# Patient Record
Sex: Male | Born: 1980 | Race: White | Hispanic: No | Marital: Married | Smoking: Never smoker
Health system: Southern US, Community
[De-identification: ages and names within clinical notes are randomized; demographics above are authoritative.]

## PROBLEM LIST (undated history)

## (undated) DIAGNOSIS — K219 Gastro-esophageal reflux disease without esophagitis: Secondary | ICD-10-CM

---

## 2006-03-09 ENCOUNTER — Emergency Department: Payer: Self-pay | Admitting: Emergency Medicine

## 2006-12-25 ENCOUNTER — Emergency Department: Payer: Self-pay | Admitting: Emergency Medicine

## 2007-01-28 ENCOUNTER — Emergency Department: Payer: Self-pay | Admitting: Emergency Medicine

## 2007-02-04 ENCOUNTER — Ambulatory Visit: Payer: Self-pay | Admitting: Urology

## 2007-10-14 ENCOUNTER — Emergency Department: Payer: Self-pay | Admitting: Emergency Medicine

## 2007-10-21 ENCOUNTER — Emergency Department: Payer: Self-pay | Admitting: Emergency Medicine

## 2008-06-11 ENCOUNTER — Emergency Department: Payer: Self-pay | Admitting: Internal Medicine

## 2009-05-26 ENCOUNTER — Emergency Department (HOSPITAL_COMMUNITY): Admission: EM | Admit: 2009-05-26 | Discharge: 2009-05-26 | Payer: Self-pay | Admitting: Emergency Medicine

## 2010-05-26 ENCOUNTER — Emergency Department (HOSPITAL_COMMUNITY): Admission: EM | Admit: 2010-05-26 | Discharge: 2009-12-06 | Payer: Self-pay | Admitting: Emergency Medicine

## 2013-01-04 ENCOUNTER — Ambulatory Visit: Payer: Self-pay | Admitting: Emergency Medicine

## 2013-01-04 LAB — DOT URINE DIP: Protein: NEGATIVE

## 2013-05-07 ENCOUNTER — Emergency Department: Payer: Self-pay | Admitting: Emergency Medicine

## 2013-09-14 ENCOUNTER — Emergency Department: Payer: Self-pay | Admitting: Internal Medicine

## 2013-09-14 LAB — CBC WITH DIFFERENTIAL/PLATELET
BASOS ABS: 0.1 10*3/uL (ref 0.0–0.1)
BASOS PCT: 1.3 %
EOS ABS: 0.1 10*3/uL (ref 0.0–0.7)
EOS PCT: 1.6 %
HCT: 42.1 % (ref 40.0–52.0)
HGB: 14 g/dL (ref 13.0–18.0)
LYMPHS PCT: 22.5 %
Lymphocyte #: 1.8 10*3/uL (ref 1.0–3.6)
MCH: 30.2 pg (ref 26.0–34.0)
MCHC: 33.3 g/dL (ref 32.0–36.0)
MCV: 91 fL (ref 80–100)
MONO ABS: 0.7 x10 3/mm (ref 0.2–1.0)
MONOS PCT: 9.5 %
NEUTROS PCT: 65.1 %
Neutrophil #: 5.1 10*3/uL (ref 1.4–6.5)
PLATELETS: 212 10*3/uL (ref 150–440)
RBC: 4.63 10*6/uL (ref 4.40–5.90)
RDW: 13.1 % (ref 11.5–14.5)
WBC: 7.9 10*3/uL (ref 3.8–10.6)

## 2013-09-14 LAB — URINALYSIS, COMPLETE
BACTERIA: NONE SEEN
BILIRUBIN, UR: NEGATIVE
BLOOD: NEGATIVE
Glucose,UR: NEGATIVE mg/dL (ref 0–75)
Ketone: NEGATIVE
Leukocyte Esterase: NEGATIVE
NITRITE: NEGATIVE
PH: 5 (ref 4.5–8.0)
Protein: NEGATIVE
RBC, UR: NONE SEEN /HPF (ref 0–5)
SPECIFIC GRAVITY: 1.02 (ref 1.003–1.030)
Squamous Epithelial: NONE SEEN
WBC UR: <1 /HPF (ref 0–5)

## 2013-09-14 LAB — COMPREHENSIVE METABOLIC PANEL
ALK PHOS: 61 U/L
AST: 12 U/L — AB (ref 15–37)
Albumin: 3.8 g/dL (ref 3.4–5.0)
Anion Gap: 4 — ABNORMAL LOW (ref 7–16)
BILIRUBIN TOTAL: 0.4 mg/dL (ref 0.2–1.0)
BUN: 12 mg/dL (ref 7–18)
CHLORIDE: 106 mmol/L (ref 98–107)
CREATININE: 1.03 mg/dL (ref 0.60–1.30)
Calcium, Total: 8.8 mg/dL (ref 8.5–10.1)
Co2: 30 mmol/L (ref 21–32)
EGFR (African American): >60
EGFR (Non-African Amer.): >60
Glucose: 79 mg/dL (ref 65–99)
Osmolality: 278 (ref 275–301)
POTASSIUM: 3.8 mmol/L (ref 3.5–5.1)
SGPT (ALT): 17 U/L (ref 12–78)
SODIUM: 140 mmol/L (ref 136–145)
TOTAL PROTEIN: 7 g/dL (ref 6.4–8.2)

## 2013-09-14 LAB — LIPASE, BLOOD: LIPASE: 120 U/L (ref 73–393)

## 2013-09-14 LAB — TROPONIN I: Troponin-I: <0.02 ng/mL

## 2016-02-04 ENCOUNTER — Emergency Department
Admission: EM | Admit: 2016-02-04 | Discharge: 2016-02-05 | Disposition: A | Discharge status: Home / Self Care | Payer: BLUE CROSS/BLUE SHIELD | Attending: Emergency Medicine | Admitting: Emergency Medicine

## 2016-02-04 DIAGNOSIS — R1900 Intra-abdominal and pelvic swelling, mass and lump, unspecified site: Secondary | ICD-10-CM

## 2016-02-04 DIAGNOSIS — K219 Gastro-esophageal reflux disease without esophagitis: Secondary | ICD-10-CM | POA: Insufficient documentation

## 2016-02-04 DIAGNOSIS — Z87891 Personal history of nicotine dependence: Secondary | ICD-10-CM | POA: Insufficient documentation

## 2016-02-04 DIAGNOSIS — Z79899 Other long term (current) drug therapy: Secondary | ICD-10-CM | POA: Insufficient documentation

## 2016-02-04 DIAGNOSIS — R1907 Generalized intra-abdominal and pelvic swelling, mass and lump: Secondary | ICD-10-CM | POA: Diagnosis present

## 2016-02-04 HISTORY — DX: Gastro-esophageal reflux disease without esophagitis: K21.9

## 2016-02-04 LAB — CBC
HEMATOCRIT: 41.6 % (ref 40.0–52.0)
Hemoglobin: 14.5 g/dL (ref 13.0–18.0)
MCH: 31.1 pg (ref 26.0–34.0)
MCHC: 34.8 g/dL (ref 32.0–36.0)
MCV: 89.1 fL (ref 80.0–100.0)
Platelets: 200 10*3/uL (ref 150–440)
RBC: 4.67 MIL/uL (ref 4.40–5.90)
RDW: 13.3 % (ref 11.5–14.5)
WBC: 7.2 10*3/uL (ref 3.8–10.6)

## 2016-02-04 LAB — BASIC METABOLIC PANEL
ANION GAP: 6 (ref 5–15)
BUN: 13 mg/dL (ref 6–20)
CALCIUM: 9 mg/dL (ref 8.9–10.3)
CO2: 26 mmol/L (ref 22–32)
Chloride: 106 mmol/L (ref 101–111)
Creatinine, Ser: 0.97 mg/dL (ref 0.61–1.24)
GLUCOSE: 109 mg/dL — AB (ref 65–99)
POTASSIUM: 3.6 mmol/L (ref 3.5–5.1)
Sodium: 138 mmol/L (ref 135–145)

## 2016-02-04 NOTE — ED Triage Notes (Signed)
Patient reports recently started a new medication and now with abdominal swelling and dizziness.

## 2016-02-05 ENCOUNTER — Encounter: Payer: Self-pay | Admitting: Emergency Medicine

## 2016-02-05 ENCOUNTER — Emergency Department: Payer: BLUE CROSS/BLUE SHIELD

## 2016-02-05 LAB — URINALYSIS COMPLETE WITH MICROSCOPIC (ARMC ONLY)
BILIRUBIN URINE: NEGATIVE
Bacteria, UA: NONE SEEN
GLUCOSE, UA: NEGATIVE mg/dL
HGB URINE DIPSTICK: NEGATIVE
KETONES UR: NEGATIVE mg/dL
LEUKOCYTES UA: NEGATIVE
NITRITE: NEGATIVE
PH: 6 (ref 5.0–8.0)
Protein, ur: NEGATIVE mg/dL
Specific Gravity, Urine: 1.009 (ref 1.005–1.030)
Squamous Epithelial / LPF: NONE SEEN

## 2016-02-05 LAB — HEPATIC FUNCTION PANEL
ALK PHOS: 52 U/L (ref 38–126)
ALT: 20 U/L (ref 17–63)
AST: 22 U/L (ref 15–41)
Albumin: 4.3 g/dL (ref 3.5–5.0)
BILIRUBIN TOTAL: 0.4 mg/dL (ref 0.3–1.2)
Total Protein: 6.9 g/dL (ref 6.5–8.1)

## 2016-02-05 LAB — LIPASE, BLOOD: LIPASE: 19 U/L (ref 11–51)

## 2016-02-05 MED ORDER — SUCRALFATE 1 GM/10ML PO SUSP: 1.0000 g | Freq: Four times a day (QID) | ORAL | 1 refills | Status: AC

## 2016-02-05 MED ORDER — SODIUM CHLORIDE 0.9 % IV BOLUS (SEPSIS)
1000.0000 mL | Freq: Once | INTRAVENOUS | Status: AC
  Administered 2016-02-05: 1000 mL via INTRAVENOUS

## 2016-02-05 MED ORDER — DIAZEPAM 5 MG/ML IJ SOLN
2.0000 mg | Freq: Once | INTRAMUSCULAR | Status: AC
  Administered 2016-02-05: 2 mg via INTRAVENOUS
  Filled 2016-02-05: qty 2

## 2016-02-05 MED ORDER — FAMOTIDINE IN NACL 20-0.9 MG/50ML-% IV SOLN
20.0000 mg | Freq: Once | INTRAVENOUS | Status: AC
  Administered 2016-02-05: 20 mg via INTRAVENOUS
  Filled 2016-02-05: qty 50

## 2016-02-05 MED ORDER — DIATRIZOATE MEGLUMINE & SODIUM 66-10 % PO SOLN
15.0000 mL | Freq: Once | ORAL | Status: AC
  Administered 2016-02-05: 15 mL via ORAL

## 2016-02-05 NOTE — Discharge Instructions (Signed)
1. You may stop Protonix. 2. Start Carafate as prescribed. 3. Return to the ER for worsening symptoms, persistent vomiting, difficult to breathing or other concerns.

## 2016-02-05 NOTE — Consult Note (Signed)
Prelim on ABD CT from today.  Images prelimed on machine due to technical error. No prior available.  Mandatory followup on final report.  Abd pain.  Small free fluid may reflect intraabd inflammation but no source is identified. Normal appendix. Left renal milk of calcium  DW Dr Algis LimingSung  JWatts MD

## 2016-02-05 NOTE — ED Provider Notes (Signed)
[facility] Emergency Department Provider Note   ____________________________________________   First MD Initiated Contact with Patient 02/05/16 0002     (approximate)  I have reviewed the triage vital signs and the nursing notes.   HISTORY  Chief Complaint Dizziness    HPI Louis Cannon is a 35 y.o. male who presents to the ED from home with a chief complaint of dizziness and abdominal swelling. Patient has been undergoing outpatient evaluation for worsening reflux. He was started on Protonix one week ago. Reports that after he began Protonix, he started to experience generalized abdominal swelling with bloating and burning sensation. Also reports onset of dizziness like the room is spinning since starting Protonix. 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 [facility]. States he did not have any imaging studies and presents to this emergency department requesting further evaluation for the etiology of his abdominal swelling. Denies associated fever, chills, chest pain, shortness of breath, nausea, vomiting, diarrhea. Denies recent travel or trauma. Denies excessive EtOH, NSAIDs, spicy food use. States he has tried OTC acid blockers and Tums without relief of symptoms. Reports he is supposed to start Carafate. Otherwise, nothing makes his symptoms better or worse.   Past medical history GERD  There are no active problems to display for this patient.   Past surgical history None  Prior to Admission medications   Medication Sig Start Date End Date Taking? Authorizing Provider  pantoprazole (PROTONIX) 40 MG tablet Take 40 mg by mouth daily.   Yes Historical Provider, MD  sucralfate (CARAFATE) 1 GM/10ML suspension Take 10 mLs (1 g total) by mouth 4 (four) times daily. 02/05/16   Irean HongJade J Roena Sassaman, MD    Allergies Review of patient's allergies indicates no known allergies.  History  reviewed. No pertinent family history.  Social History Social History  Substance Use Topics  . Smoking status: Never Smoker  . Smokeless tobacco: Former NeurosurgeonUser  . Alcohol use No  + smoker  Review of Systems  Constitutional: No fever/chills. Eyes: No visual changes. ENT: No sore throat. Cardiovascular: Denies chest pain. Respiratory: Denies shortness of breath. Gastrointestinal: Positive for abdominal pain and swelling.  No nausea, no vomiting.  No diarrhea.  No constipation. Genitourinary: Negative for dysuria. Musculoskeletal: Negative for back pain. Skin: Negative for rash. Neurological: Positive for dizziness. Negative for headaches, focal weakness or numbness.  10-point ROS otherwise negative.  ____________________________________________   PHYSICAL EXAM:  VITAL SIGNS: ED Triage Vitals [02/04/16 2246]  Enc Vitals Group     BP 129/70     Pulse Rate 63     Resp 18     Temp 98.2 F (36.8 C)     Temp Source Oral     SpO2 98 %     Weight 140 lb (63.5 kg)     Height 5\' 9"  (1.753 m)     Head Circumference      Peak Flow      Pain Score      Pain Loc      Pain Edu?      Excl. in GC?     Constitutional: Alert and oriented. Well appearing and in no acute distress. Eyes: Conjunctivae are normal. PERRL. EOMI. Head: Atraumatic. Nose: No congestion/rhinnorhea. Mouth/Throat: Mucous membranes are moist.  Oropharynx non-erythematous. Neck: No stridor.   Cardiovascular: Normal rate, regular rhythm. Grossly normal heart sounds.  Good peripheral circulation. Respiratory: Normal respiratory  effort.  No retractions. Lungs CTAB. Gastrointestinal: Thin. Soft and mildly tender to palpation upper abdomen without rebound or guarding. Mild distention. No abdominal bruits. No CVA tenderness. Musculoskeletal: No lower extremity tenderness nor edema.  No joint effusions. Neurologic:  Normal speech and language. No gross focal neurologic deficits are appreciated. No gait  instability. Skin:  Skin is warm, dry and intact. No rash noted. Psychiatric: Mood and affect are normal. Speech and behavior are normal.  ____________________________________________   LABS (all labs ordered are listed, but only abnormal results are displayed)  Labs Reviewed  BASIC METABOLIC PANEL - Abnormal; Notable for the following:       Result Value   Glucose, Bld 109 (*)    All other components within normal limits  URINALYSIS COMPLETEWITH MICROSCOPIC ([facility] ONLY) - Abnormal; Notable for the following:    Color, Urine STRAW (*)    APPearance CLEAR (*)    All other components within normal limits  HEPATIC FUNCTION PANEL - Abnormal; Notable for the following:    Bilirubin, Direct <0.1 (*)    All other components within normal limits  CBC  LIPASE, BLOOD   ____________________________________________  EKG  None ____________________________________________  RADIOLOGY  CT abdomen/pelvis discussed with Dr. Grace IsaacWatts: Nonspecific free fluid, otherwise no acute abnormality. ____________________________________________   PROCEDURES  Procedure(s) performed: None  Procedures  Critical Care performed: No  ____________________________________________   INITIAL IMPRESSION / ASSESSMENT AND PLAN / ED COURSE  Pertinent labs & imaging results that were available during my care of the patient were reviewed by me and considered in my medical decision making (see chart for details).  35 year old male who presents with dizziness and abdominal swelling which he thinks are side effects to starting Protonix. [facility] unremarkable with lab work only. Discussed with patient and spouse ultrasound versus CT imaging. I personally feel that ultrasound would be nonspecific; will proceed with CT abdomen/pelvis.  Clinical Course  Comment By Time  Delay secondary to radiology; CT scanners down. Apologized to patient who is understanding. Irean HongJade J Cecila Satcher, MD 08/19 0330   Discuss patient's CT scan with radiologist Dr. Grace IsaacWatts; nonspecific free fluid, otherwise no acute abnormality. Updated patient who is sleeping soundly. We agreed that he would stop taking protonix. Will prescribe Carafate and patient will follow-up with GI as scheduled. Strict return precautions given. Patient and spouse verbalize understanding and agree with plan of care. Irean HongJade J Milinda Sweeney, MD 08/19 [phone]     ____________________________________________   FINAL CLINICAL IMPRESSION(S) / ED DIAGNOSES  Final diagnoses:  Abdominal swelling  Gastroesophageal reflux disease, esophagitis presence not specified      NEW MEDICATIONS STARTED DURING THIS VISIT:  New Prescriptions   SUCRALFATE (CARAFATE) 1 GM/10ML SUSPENSION    Take 10 mLs (1 g total) by mouth 4 (four) times daily.     Note:  This document was prepared using Dragon voice recognition software and may include unintentional dictation errors.    Irean HongJade J Ekin Pilar, MD 02/05/16 [phone]

## 2017-08-16 IMAGING — CT CT ABD-PELV W/ CM
1 of 2 series · 15 of 32 positions shown, 19 images · IV contrast (agent unspecified)
Comparison: None.

CLINICAL DATA: Abdominal pain with bloating

EXAM:
CT ABDOMEN AND PELVIS WITH CONTRAST
TECHNIQUE: Multidetector CT imaging of the abdomen and pelvis was performed
using the standard protocol following bolus administration of
intravenous contrast. Oral contrast was also administered.
CONTRAST:  100 mL [id number] nonionic

[Series 2: routine abd pel with · axial · 0.63mm/px · z∈[-502,-42]mm · 15 of 101 slices shown, 19 images]
[im 5/101  soft-tissue]
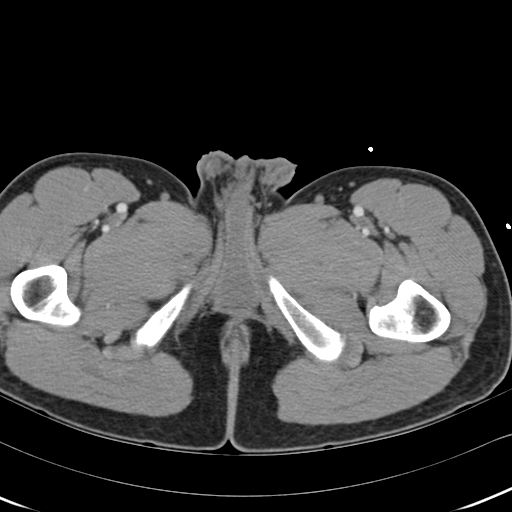
[im 5/101  bone]
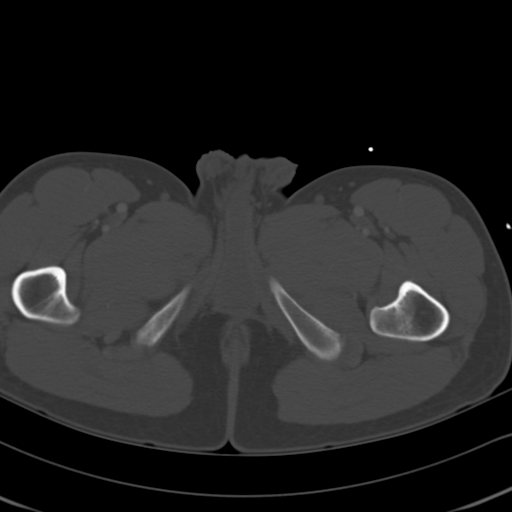
[im 13/101  soft-tissue]
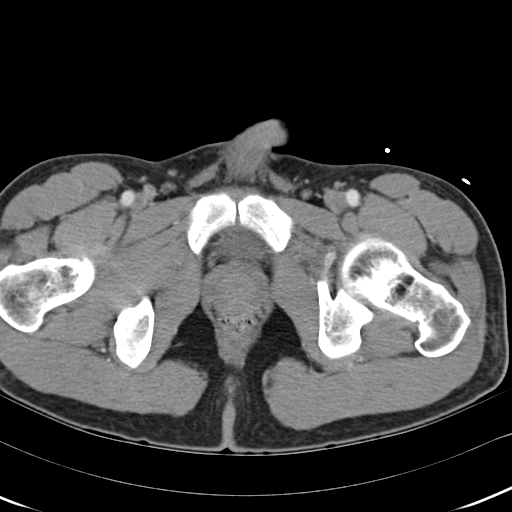
[im 21/101  soft-tissue]
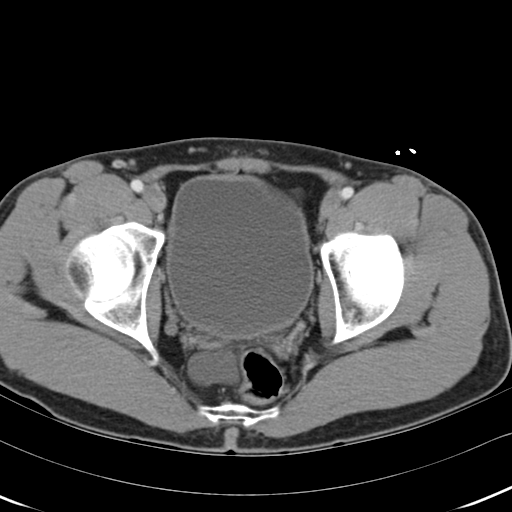
[im 29/101  soft-tissue]
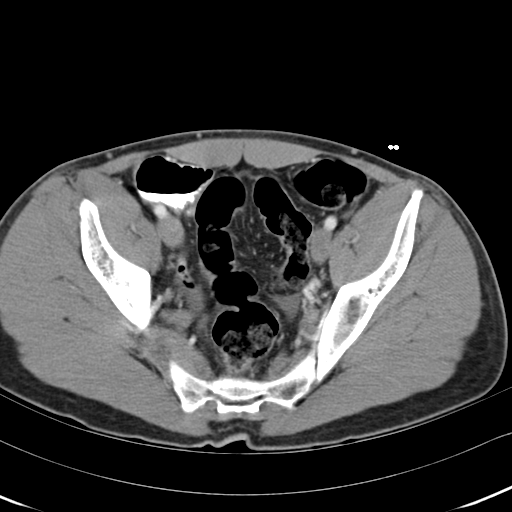
[im 37/101  soft-tissue]
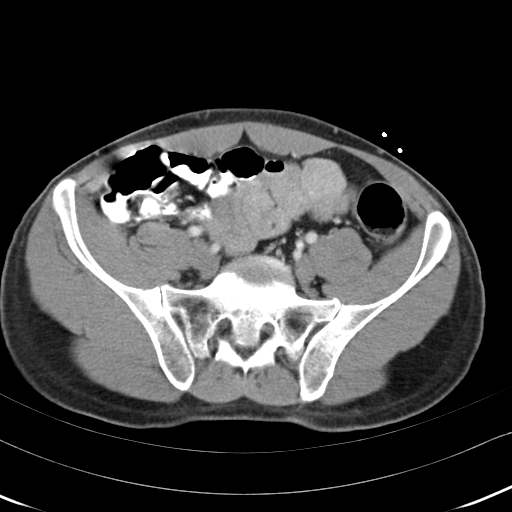
[im 45/101  soft-tissue]
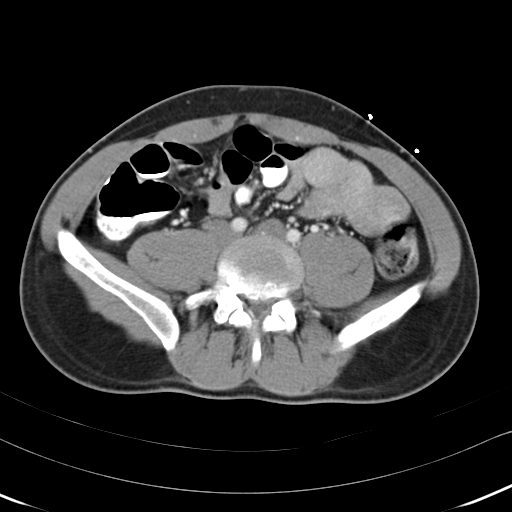
[im 53/101  soft-tissue]
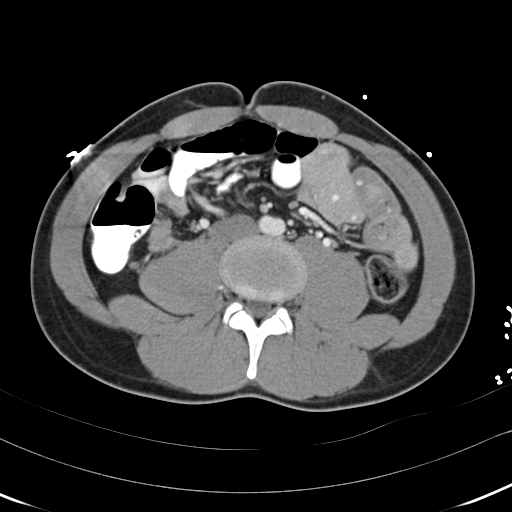
[im 57/101  soft-tissue]
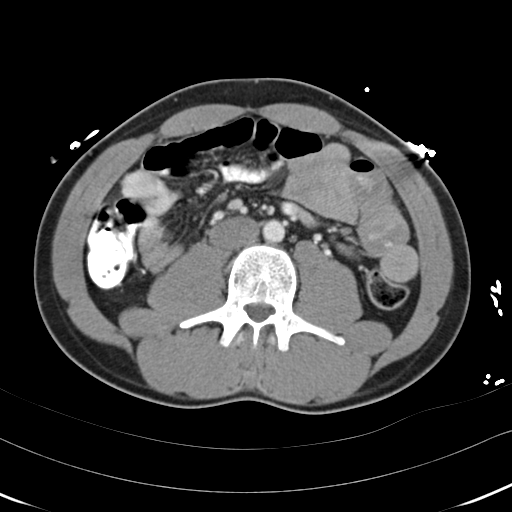
[im 65/101  soft-tissue]
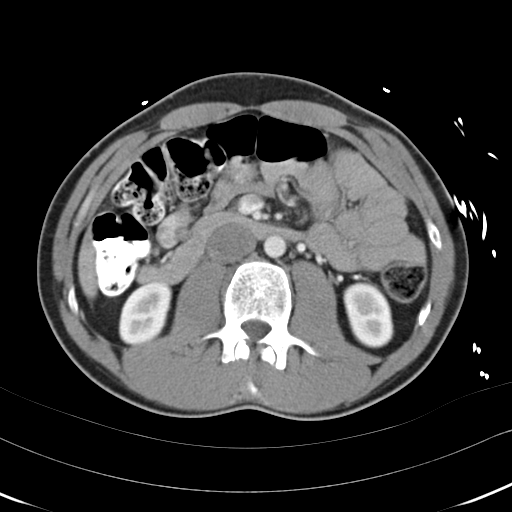
[im 65/101  bone]
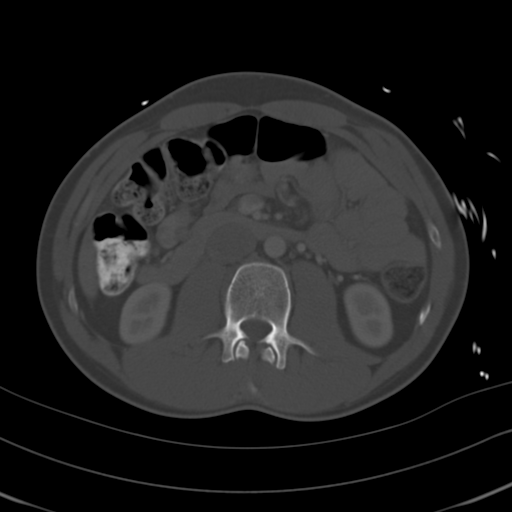
[im 73/101  soft-tissue]
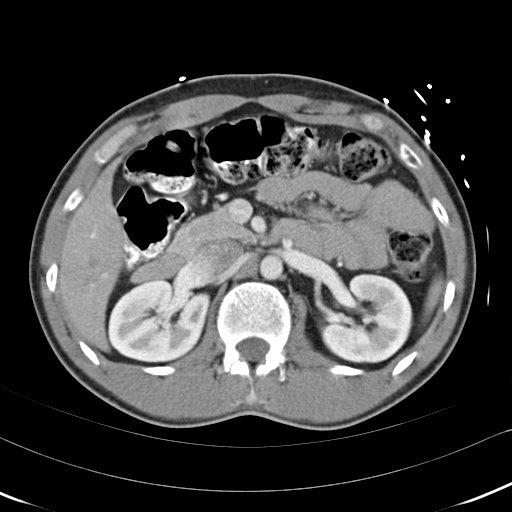
[im 81/101  soft-tissue]
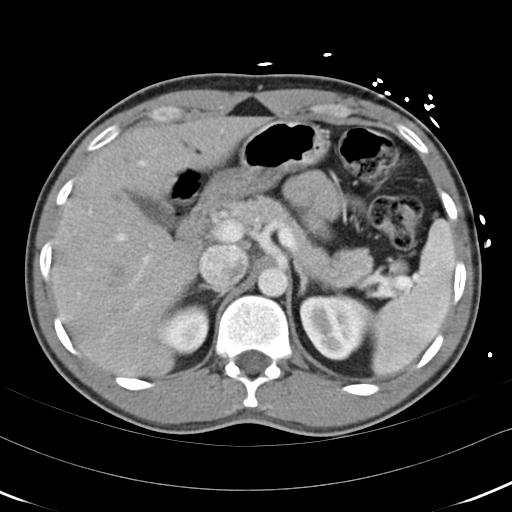
[im 85/101  lung]
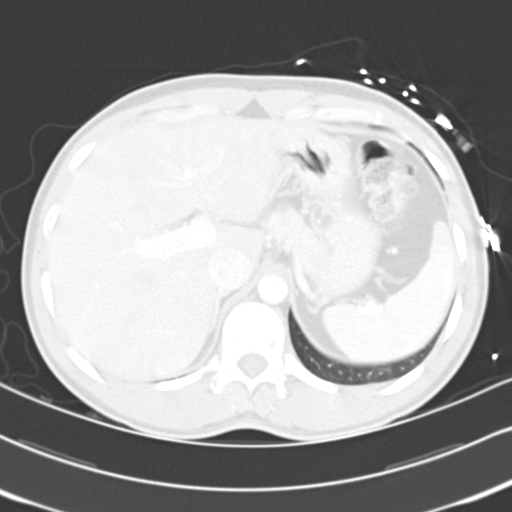
[im 89/101  soft-tissue]
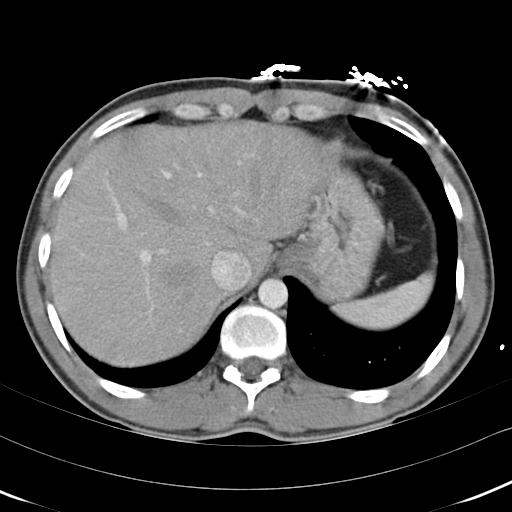
[im 89/101  lung]
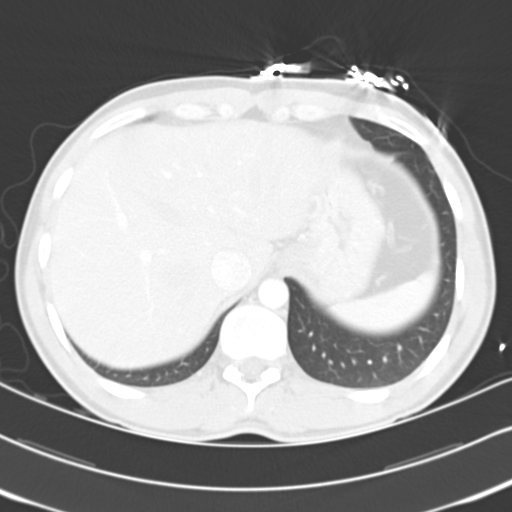
[im 93/101  lung]
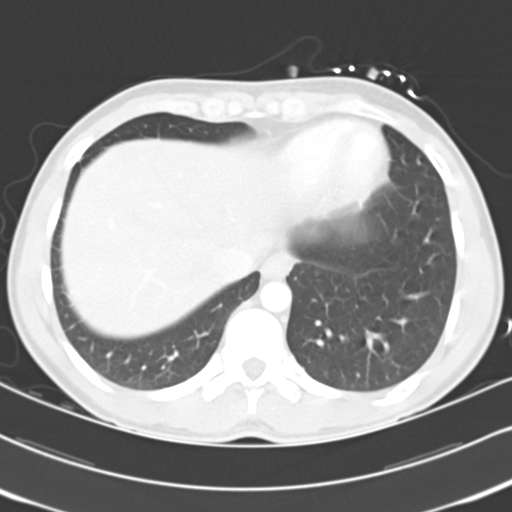
[im 97/101  soft-tissue]
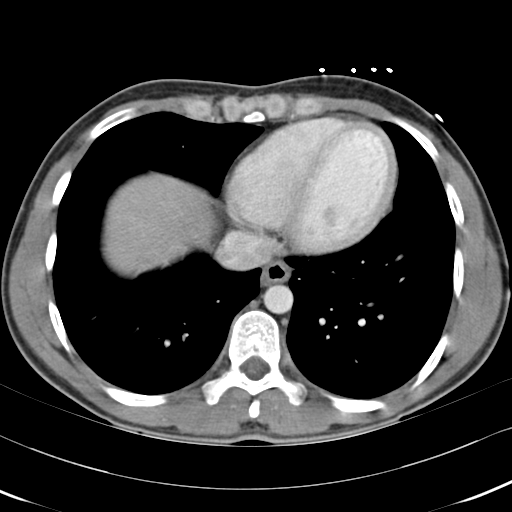
[im 97/101  lung]
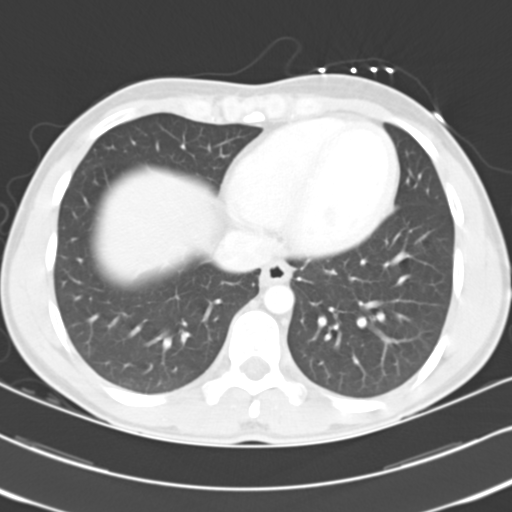

[15 of 32 positions shown; findings below may reference images not displayed]

FINDINGS: Lower chest:  Lung bases are clear.

Hepatobiliary: There is felt to be a degree of hepatic steatosis. No
focal liver lesions are evident. Gallbladder wall is not appreciably
thickened. There is no biliary duct dilatation.

Pancreas: No pancreatic mass or inflammatory focus.

Spleen: No splenic lesion evident.

Adrenals/Urinary Tract: Adrenals appear normal bilaterally. There is
a focal calculus measuring 6 x 6 mm in the posterior mid to lower
pole left kidney. There is no renal mass or hydronephrosis on either
side. There is no ureteral calculus on either side. Urinary bladder
is midline with wall thickness within normal limits.

Stomach/Bowel: There is no appreciable bowel wall or mesenteric
thickening. No bowel obstruction. No free air or portal venous air.

Vascular/Lymphatic: There is no abdominal aortic aneurysm. No
vascular lesions are evident on this study. There is no appreciable
adenopathy in the abdomen or pelvis.

Reproductive: Prostate and seminal vesicles appear normal. No pelvic
mass or pelvic fluid collection evident.

Other: Appendix appears unremarkable. There is mild ascites in the
dependent portion the pelvis. There is no abscess in the abdomen or
pelvis.

Musculoskeletal: There is mid lumbar levoscoliosis. There are no
blastic or lytic bone lesions. There is no intramuscular or
abdominal wall lesion.
IMPRESSION: Mild ascites in the pelvis of uncertain etiology. No ascites
elsewhere.

No bowel wall thickening or bowel obstruction. No abscess. Appendix
region appears normal.

There is hepatic steatosis.

There is a 6 mm calculus in the mid to lower pole left kidney
posteriorly. No hydronephrosis. No ureteral calculus on either side.

## 2019-05-26 ENCOUNTER — Other Ambulatory Visit: Payer: Self-pay

## 2019-05-26 DIAGNOSIS — Z20822 Contact with and (suspected) exposure to covid-19: Secondary | ICD-10-CM

## 2019-05-29 LAB — NOVEL CORONAVIRUS, NAA: SARS-CoV-2, NAA: NOT DETECTED

## 2019-06-23 ENCOUNTER — Ambulatory Visit: Payer: Medicaid Other | Attending: Internal Medicine

## 2019-06-23 DIAGNOSIS — Z20822 Contact with and (suspected) exposure to covid-19: Secondary | ICD-10-CM

## 2019-06-24 LAB — NOVEL CORONAVIRUS, NAA: SARS-CoV-2, NAA: NOT DETECTED

## 2019-07-14 ENCOUNTER — Ambulatory Visit: Payer: Medicaid Other | Attending: Internal Medicine

## 2019-07-14 DIAGNOSIS — Z20822 Contact with and (suspected) exposure to covid-19: Secondary | ICD-10-CM

## 2019-07-15 LAB — NOVEL CORONAVIRUS, NAA: SARS-CoV-2, NAA: NOT DETECTED

## 2023-04-26 ENCOUNTER — Ambulatory Visit
Payer: No Typology Code available for payment source | Attending: Student in an Organized Health Care Education/Training Program | Admitting: Student in an Organized Health Care Education/Training Program

## 2023-04-26 ENCOUNTER — Encounter: Payer: Self-pay | Admitting: Student in an Organized Health Care Education/Training Program

## 2023-04-26 VITALS — BP 100/67 | HR 70 | Temp 98.0°F | Resp 16 | Ht 69.0 in | Wt 150.0 lb

## 2023-04-26 DIAGNOSIS — M25552 Pain in left hip: Secondary | ICD-10-CM | POA: Insufficient documentation

## 2023-04-26 DIAGNOSIS — G8929 Other chronic pain: Secondary | ICD-10-CM | POA: Insufficient documentation

## 2023-04-26 DIAGNOSIS — M5416 Radiculopathy, lumbar region: Secondary | ICD-10-CM | POA: Insufficient documentation

## 2023-04-26 MED ORDER — LIDOCAINE 5 % EX PTCH: 1.0000 | MEDICATED_PATCH | Freq: Two times a day (BID) | CUTANEOUS | 11 refills | Status: AC

## 2023-04-26 NOTE — Progress Notes (Signed)
Safety precautions to be maintained throughout the outpatient stay will include: orient to surroundings, keep bed in low position, maintain call bell within reach at all times, provide assistance with transfer out of bed and ambulation.  
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
## 2023-04-26 NOTE — Patient Instructions (Addendum)
Please help patient get lumbar CT [facility] Schedule for left L5 and S1 TF ESI Left hip MRI   ______________________________________________________________________    Preparing for your procedure  Appointments: If you think you may not be able to keep your appointment, call 24-48 hours in advance to cancel. We need time to make it available to others.  During your procedure appointment there will be: No Prescription Refills. No disability issues to discussed. No medication changes or discussions.  Instructions: Food intake: Avoid eating anything solid for at least 8 hours prior to your procedure. Clear liquid intake: You may take clear liquids such as water up to 2 hours prior to your procedure. (No carbonated drinks. No soda.) Transportation: Unless otherwise stated by your physician, bring a driver. (Driver cannot be a Market researcher, Pharmacist, community, or any other form of public transportation.) Morning Medicines: Except for blood thinners, take all of your other morning medications with a sip of water. Make sure to take your heart and blood pressure medicines. If your blood pressure's lower number is above 100, the case will be rescheduled. Blood thinners: Make sure to stop your blood thinners as instructed.  If you take a blood thinner, but were not instructed to stop it, call our office [phone] and ask to talk to a nurse. Not stopping a blood thinner prior to certain procedures could lead to serious complications. Diabetics on insulin: Notify the staff so that you can be scheduled 1st case in the morning. If your diabetes requires high dose insulin, take only  of your normal insulin dose the morning of the procedure and notify the staff that you have done so. Preventing infections: Shower with an antibacterial soap the morning of your procedure.  Build-up your immune system: Take 1000 mg of Vitamin C with every meal (3 times a day) the day prior to your procedure. Antibiotics:  Inform the nursing staff if you are taking any antibiotics or if you have any conditions that may require antibiotics prior to procedures. (Example: recent joint implants)   Pregnancy: If you are pregnant make sure to notify the nursing staff. Not doing so may result in injury to the fetus, including death.  Sickness: If you have a cold, fever, or any active infections, call and cancel or reschedule your procedure. Receiving steroids while having an infection may result in complications. Arrival: You must be in the facility at least 30 minutes prior to your scheduled procedure. Tardiness: Your scheduled time is also the cutoff time. If you do not arrive at least 15 minutes prior to your procedure, you will be rescheduled.  Children: Do not bring any children with you. Make arrangements to keep them home. Dress appropriately: There is always a possibility that your clothing may get soiled. Avoid long dresses. Valuables: Do not bring any jewelry or valuables.  Reasons to call and reschedule or cancel your procedure: (Following these recommendations will minimize the risk of a serious complication.) Surgeries: Avoid having procedures within 2 weeks of any surgery. (Avoid for 2 weeks before or after any surgery). Flu Shots: Avoid having procedures within 2 weeks of a flu shots or . (Avoid for 2 weeks before or after immunizations). Barium: Avoid having a procedure within 7-10 days after having had a radiological study involving the use of radiological contrast. (Myelograms, Barium swallow or enema study). Heart attacks: Avoid any elective procedures or surgeries for the initial 6 months after a "Myocardial Infarction" (Heart Attack). Blood thinners: It is  imperative that you stop these medications before procedures. Let us know if you if you take any blood thinner.  Infection: Avoid procedures during or within two weeks of an infection (including chest colds or gastrointestinal problems). Symptoms  associated with infections include: Localized redness, fever, chills, night sweats or profuse sweating, burning sensation when voiding, cough, congestion, stuffiness, runny nose, sore throat, diarrhea, nausea, vomiting, cold or Flu symptoms, recent or current infections. It is specially important if the infection is over the area that we intend to treat. Heart and lung problems: Symptoms that may suggest an active cardiopulmonary problem include: cough, chest pain, breathing difficulties or shortness of breath, dizziness, ankle swelling, uncontrolled high or unusually low blood pressure, and/or palpitations. If you are experiencing any of these symptoms, cancel your procedure and contact your primary care physician for an evaluation.  Remember:  Regular Business hours are:  Monday to Thursday 8:00 AM to 4:00 PM  Provider's Schedule: Delano Metz, MD:  Procedure days: Tuesday and Thursday 7:30 AM to 4:00 PM  Edward Jolly, MD:  Procedure days: Monday and Wednesday 7:30 AM to 4:00 PM Last  Updated: 02/06/2023 ______________________________________________________________________

## 2023-04-30 ENCOUNTER — Ambulatory Visit
Admission: RE | Admit: 2023-04-30 | Discharge: 2023-04-30 | Disposition: A | Discharge status: Home / Self Care | Payer: No Typology Code available for payment source | Source: Ambulatory Visit | Attending: Student in an Organized Health Care Education/Training Program | Admitting: Student in an Organized Health Care Education/Training Program

## 2023-04-30 DIAGNOSIS — M25552 Pain in left hip: Secondary | ICD-10-CM | POA: Diagnosis present

## 2023-05-16 ENCOUNTER — Ambulatory Visit
Admission: RE | Admit: 2023-05-16 | Discharge: 2023-05-16 | Disposition: A | Discharge status: Home / Self Care | Payer: No Typology Code available for payment source | Source: Ambulatory Visit | Attending: Student in an Organized Health Care Education/Training Program | Admitting: Student in an Organized Health Care Education/Training Program

## 2023-05-16 ENCOUNTER — Encounter: Payer: Self-pay | Admitting: Student in an Organized Health Care Education/Training Program

## 2023-05-16 ENCOUNTER — Ambulatory Visit
Payer: No Typology Code available for payment source | Attending: Student in an Organized Health Care Education/Training Program | Admitting: Student in an Organized Health Care Education/Training Program

## 2023-05-16 VITALS — BP 112/80 | HR 61 | Temp 97.4°F | Resp 17 | Ht 69.0 in | Wt 158.0 lb

## 2023-05-16 DIAGNOSIS — M5416 Radiculopathy, lumbar region: Secondary | ICD-10-CM | POA: Diagnosis not present

## 2023-05-16 MED ORDER — SODIUM CHLORIDE (PF) 0.9 % IJ SOLN
INTRAMUSCULAR | Status: AC
  Filled 2023-05-16: qty 10

## 2023-05-16 MED ORDER — DIAZEPAM 5 MG PO TABS
5.0000 mg | ORAL_TABLET | ORAL | Status: AC
  Administered 2023-05-16: 5 mg via ORAL

## 2023-05-16 MED ORDER — DIAZEPAM 5 MG PO TABS
ORAL_TABLET | ORAL | Status: AC
  Filled 2023-05-16: qty 1

## 2023-05-16 MED ORDER — ROPIVACAINE HCL 2 MG/ML IJ SOLN
2.0000 mL | Freq: Once | INTRAMUSCULAR | Status: AC
  Administered 2023-05-16: 2 mL via EPIDURAL

## 2023-05-16 MED ORDER — DEXAMETHASONE SODIUM PHOSPHATE 10 MG/ML IJ SOLN
INTRAMUSCULAR | Status: AC
  Filled 2023-05-16: qty 2

## 2023-05-16 MED ORDER — DEXAMETHASONE SODIUM PHOSPHATE 10 MG/ML IJ SOLN
20.0000 mg | Freq: Once | INTRAMUSCULAR | Status: AC
  Administered 2023-05-16: 20 mg

## 2023-05-16 MED ORDER — ROPIVACAINE HCL 2 MG/ML IJ SOLN
INTRAMUSCULAR | Status: AC
  Filled 2023-05-16: qty 20

## 2023-05-16 MED ORDER — SODIUM CHLORIDE 0.9% FLUSH
2.0000 mL | Freq: Once | INTRAVENOUS | Status: AC
  Administered 2023-05-16: 2 mL

## 2023-05-16 MED ORDER — LIDOCAINE HCL 2 % IJ SOLN
INTRAMUSCULAR | Status: AC
  Filled 2023-05-16: qty 20

## 2023-05-16 MED ORDER — IOHEXOL 180 MG/ML  SOLN
10.0000 mL | Freq: Once | INTRAMUSCULAR | Status: AC
  Administered 2023-05-16: 10 mL via EPIDURAL

## 2023-05-16 MED ORDER — IOHEXOL 180 MG/ML  SOLN
INTRAMUSCULAR | Status: AC
  Filled 2023-05-16: qty 20

## 2023-05-16 MED ORDER — LIDOCAINE HCL 2 % IJ SOLN
20.0000 mL | Freq: Once | INTRAMUSCULAR | Status: AC
  Administered 2023-05-16: 400 mg

## 2023-05-16 NOTE — Patient Instructions (Signed)
____________________________________________________________________________________________  Post-Procedure Discharge Instructions  Instructions: Apply ice:  Purpose: This will minimize any swelling and discomfort after procedure.  When: Day of procedure, as soon as you get home. How: Fill a plastic sandwich bag with crushed ice. Cover it with a small towel and apply to injection site. How long: (15 min on, 15 min off) Apply for 15 minutes then remove x 15 minutes.  Repeat sequence on day of procedure, until you go to bed. Apply heat:  Purpose: To treat any soreness and discomfort from the procedure. When: Starting the next day after the procedure. How: Apply heat to procedure site starting the day following the procedure. How long: May continue to repeat daily, until discomfort goes away. Food intake: Start with clear liquids (like water) and advance to regular food, as tolerated.  Physical activities: Keep activities to a minimum for the first 8 hours after the procedure. After that, then as tolerated. Driving: If you have received any sedation, be responsible and do not drive. You are not allowed to drive for 24 hours after having sedation. Blood thinner: (Applies only to those taking blood thinners) You may restart your blood thinner 6 hours after your procedure. Insulin: (Applies only to Diabetic patients taking insulin) As soon as you can eat, you may resume your normal dosing schedule. Infection prevention: Keep procedure site clean and dry. Shower daily and clean area with soap and water. Post-procedure Pain Diary: Extremely important that this be done correctly and accurately. Recorded information will be used to determine the next step in treatment. For the purpose of accuracy, follow these rules: Evaluate only the area treated. Do not report or include pain from an untreated area. For the purpose of this evaluation, ignore all other areas of pain, except for the treated area. After  your procedure, avoid taking a long nap and attempting to complete the pain diary after you wake up. Instead, set your alarm clock to go off every hour, on the hour, for the initial 8 hours after the procedure. Document the duration of the numbing medicine, and the relief you are getting from it. Do not go to sleep and attempt to complete it later. It will not be accurate. If you received sedation, it is likely that you were given a medication that may cause amnesia. Because of this, completing the diary at a later time may cause the information to be inaccurate. This information is needed to plan your care. Follow-up appointment: Keep your post-procedure follow-up evaluation appointment after the procedure (usually 2 weeks for most procedures, 6 weeks for radiofrequencies). DO NOT FORGET to bring you pain diary with you.   Expect: (What should I expect to see with my procedure?) From numbing medicine (AKA: Local Anesthetics): Numbness or decrease in pain. You may also experience some weakness, which if present, could last for the duration of the local anesthetic. Onset: Full effect within 15 minutes of injected. Duration: It will depend on the type of local anesthetic used. On the average, 1 to 8 hours.  From steroids (Applies only if steroids were used): Decrease in swelling or inflammation. Once inflammation is improved, relief of the pain will follow. Onset of benefits: Depends on the amount of swelling present. The more swelling, the longer it will take for the benefits to be seen. In some cases, up to 10 days. Duration: Steroids will stay in the system x 2 weeks. Duration of benefits will depend on multiple posibilities including persistent irritating factors. Side-effects: If present, they  may typically last 2 weeks (the duration of the steroids). Frequent: Cramps (if they occur, drink Gatorade and take over-the-counter Magnesium 450-500 mg once to twice a day); water retention with temporary  weight gain; increases in blood sugar; decreased immune system response; increased appetite. Occasional: Facial flushing (red, warm cheeks); mood swings; menstrual changes. Uncommon: Long-term decrease or suppression of natural hormones; bone thinning. (These are more common with higher doses or more frequent use. This is why we prefer that our patients avoid having any injection therapies in other practices.)  Very Rare: Severe mood changes; psychosis; aseptic necrosis. From procedure: Some discomfort is to be expected once the numbing medicine wears off. This should be minimal if ice and heat are applied as instructed.  Call if: (When should I call?) You experience numbness and weakness that gets worse with time, as opposed to wearing off. New onset bowel or bladder incontinence. (Applies only to procedures done in the spine)  Emergency Numbers: Durning business hours (Monday - Thursday, 8:00 AM - 4:00 PM) (Friday, 9:00 AM - 12:00 Noon): [phone] After hours: [phone] NOTE: If you are having a problem and are unable connect with, or to talk to a provider, then go to your nearest urgent care or emergency department. If the problem is serious and urgent, please call 911. ____________________________________________________________________________________________  Selective Nerve Root Block Patient Information  Description: Specific nerve roots exit the spinal canal and these nerves can be compressed and inflamed by a bulging disc and bone spurs.  By injecting steroids on the nerve root, we can potentially decrease the inflammation surrounding these nerves, which often leads to decreased pain.  Also, by injecting local anesthesia on the nerve root, this can provide [place] helpful information to give to your referring doctor if it decreases your pain.  Selective nerve root blocks can be done along the spine from the neck to the low back depending on the location of your pain.   After numbing  the skin with local anesthesia, a small needle is passed to the nerve root and the position of the needle is verified using x-ray pictures.  After the needle is in correct position, we then deposit the medication.  You may experience a pressure sensation while this is being done.  The entire block usually lasts less than 15 minutes.  Conditions that may be treated with selective nerve root blocks: Low back and leg pain Spinal stenosis Diagnostic block prior to potential surgery Neck and arm pain Post laminectomy syndrome  Preparation for the injection:  Do not eat any solid food or dairy products within 8 hours of your appointment. You may drink clear liquids up to 3 hours before an appointment.  Clear liquids include water, black coffee, juice or soda.  No milk or cream please. You may take your regular medications, including pain medications, with a sip of water before your appointment.  Diabetics should hold regular insulin (if taken separately) and take 1/2 normal NPH dose the morning of the procedure.  Carry some sugar containing items with you to your appointment. A driver must accompany you and be prepared to drive you home after your procedure. Bring all your current medications with you. An IV may be inserted and sedation may be given at the discretion of the physician. A blood pressure cuff, EKG, and other monitors will often be applied during the procedure.  Some patients may need to have extra oxygen administered for a short period. You will be asked to provide  medical information, including allergies, prior to the procedure.  We must know immediately if you are taking blood  Thinners (like Coumadin) or if you are allergic to IV iodine contrast (dye).  Possible side-effects: All are usually temporary Bleeding from needle site Light headedness Numbness and tingling Decreased blood pressure Weakness in arms/legs Pressure sensation in back/neck Pain at injection site (several  days)  Possible complications: All are extremely rare Infection Nerve injury Spinal headache (a headache wore with upright position)  Call if you experience: Fever/chills associated with headache or increased back/neck pain Headache worsened by an upright position New onset weakness or numbness of an extremity below the injection site Hives or difficulty breathing (go to the emergency room) Inflammation or drainage at the injection site(s) Severe back/neck pain greater than usual New symptoms which are concerning to you  Please note:  Although the local anesthetic injected can often make your back or neck feel good for several hours after the injection the pain will likely return.  It takes 3-5 days for steroids to work on the nerve root. You may not notice any pain relief for at least one week.  If effective, we will often do a series of 3 injections spaced 3-6 weeks apart to maximally decrease your pain.    If you have any questions, please call [phone] [facility]

## 2023-05-16 NOTE — Progress Notes (Signed)
PROVIDER NOTE: Interpretation of information contained herein should be left to medically-trained personnel. Specific patient instructions are provided elsewhere under "Patient Instructions" section of medical record. This document was created in part using STT-dictation technology, any transcriptional errors that may result from this process are unintentional.  Patient: Louis Cannon Type: Established DOB: 12-11-1980 MRN: [id number] PCP: Pcp, No  Service: Procedure DOS: 05/16/2023 Setting: Ambulatory Location: Ambulatory outpatient facility Delivery: Face-to-face Provider: Edward Jolly, MD Specialty: Interventional Pain Management Specialty designation: 09 Location: Outpatient facility Ref. Prov.: No ref. provider found       Interventional Therapy   Procedure: Lumbar trans-foraminal epidural steroid injection (L-TFESI) #1  Laterality: Left (-LT)  Level: L5 & S1 nerve root(s) Imaging: Fluoroscopy-guided         Anesthesia: Local anesthesia (1-2% Lidocaine) Sedation: Minimal Sedation                       DOS: 05/16/2023  Performed by: Edward Jolly, MD  Purpose: Diagnostic/Therapeutic Indications: Lumbar radicular pain severe enough to impact quality of life or function. No diagnosis found. NAS-11 Pain score:   Pre-procedure: 8 /10   Post-procedure: 2 /10     Position / Prep / Materials:  Position: Prone  Prep solution: ChloraPrep (2% chlorhexidine gluconate and 70% isopropyl alcohol) Prep Area: Entire Posterior Lumbosacral Area.  From the lower tip of the scapula down to the tailbone and from flank to flank. Materials:  Tray: Block Needle(s):  Type: Spinal  Gauge (G): 22  Length: 5-in  Qty: 2      H&P (Pre-op Assessment):  Louis Cannon is a 42 y.o. (year old), male patient, seen today for interventional treatment. He  has no past surgical history on file. Louis Cannon has a current medication list which includes the following prescription(s): bupropion, celecoxib, gabapentin,  lidocaine, sucralfate, and pantoprazole. His primarily concern today is the Back Pain (lower)  Initial Vital Signs:  Pulse/HCG Rate: 61ECG Heart Rate: 66 Temp: (!) 97.4 F (36.3 C) Resp: 18 BP: 121/77 SpO2: 100 %  BMI: Estimated body mass index is 23.33 kg/m as calculated from the following:   Height as of this encounter: 5\' 9"  (1.753 m).   Weight as of this encounter: 158 lb (71.7 kg).  Risk Assessment: Allergies: Reviewed. He has No Known Allergies.  Allergy Precautions: None required Coagulopathies: Reviewed. None identified.  Blood-thinner therapy: None at this time Active Infection(s): Reviewed. None identified. Louis Cannon is afebrile  Site Confirmation: Louis Cannon was asked to confirm the procedure and laterality before marking the site Procedure checklist: Completed Consent: Before the procedure and under the influence of no sedative(s), amnesic(s), or anxiolytics, the patient was informed of the treatment options, risks and possible complications. To fulfill our ethical and legal obligations, as recommended by the American Medical Association's Code of Ethics, I have informed the patient of my clinical impression; the nature and purpose of the treatment or procedure; the risks, benefits, and possible complications of the intervention; the alternatives, including doing nothing; the risk(s) and benefit(s) of the alternative treatment(s) or procedure(s); and the risk(s) and benefit(s) of doing nothing. The patient was provided information about the general risks and possible complications associated with the procedure. These may include, but are not limited to: failure to achieve desired goals, infection, bleeding, organ or nerve damage, allergic reactions, paralysis, and death. In addition, the patient was informed of those risks and complications associated to Spine-related procedures, such as failure to decrease pain; infection (i.e.: Meningitis,  epidural or intraspinal abscess);  bleeding (i.e.: epidural hematoma, subarachnoid hemorrhage, or any other type of intraspinal or peri-dural bleeding); organ or nerve damage (i.e.: Any type of peripheral nerve, nerve root, or spinal cord injury) with subsequent damage to sensory, motor, and/or autonomic systems, resulting in permanent pain, numbness, and/or weakness of one or several areas of the body; allergic reactions; (i.e.: anaphylactic reaction); and/or death. Furthermore, the patient was informed of those risks and complications associated with the medications. These include, but are not limited to: allergic reactions (i.e.: anaphylactic or anaphylactoid reaction(s)); adrenal axis suppression; blood sugar elevation that in diabetics may result in ketoacidosis or comma; water retention that in patients with history of congestive heart failure may result in shortness of breath, pulmonary edema, and decompensation with resultant heart failure; weight gain; swelling or edema; medication-induced neural toxicity; particulate matter embolism and blood vessel occlusion with resultant organ, and/or nervous system infarction; and/or aseptic necrosis of one or more joints. Finally, the patient was informed that Medicine is not an exact science; therefore, there is also the possibility of unforeseen or unpredictable risks and/or possible complications that may result in a catastrophic outcome. The patient indicated having understood very clearly. We have given the patient no guarantees and we have made no promises. Enough time was given to the patient to ask questions, all of which were answered to the patient's satisfaction. Louis Cannon has indicated that he wanted to continue with the procedure. Attestation: I, the ordering provider, attest that I have discussed with the patient the benefits, risks, side-effects, alternatives, likelihood of achieving goals, and potential problems during recovery for the procedure that I have provided informed  consent. Date  Time: 05/16/2023  9:08 AM   Pre-Procedure Preparation:  Monitoring: As per clinic protocol. Respiration, ETCO2, SpO2, BP, heart rate and rhythm monitor placed and checked for adequate function Safety Precautions: Patient was assessed for positional comfort and pressure points before starting the procedure. Time-out: I initiated and conducted the "Time-out" before starting the procedure, as per protocol. The patient was asked to participate by confirming the accuracy of the "Time Out" information. Verification of the correct person, site, and procedure were performed and confirmed by me, the nursing staff, and the patient. "Time-out" conducted as per Joint Commission's Universal Protocol (UP.01.01.01). Time: 0929 Start Time: 0929 hrs.  Description/Narrative of Procedure:          Target: The 6 o'clock position under the pedicle, on the affected side. Region: Posterolateral Lumbosacral Approach: Posterior Percutaneous Paravertebral approach.  Rationale (medical necessity): procedure needed and proper for the diagnosis and/or treatment of the patient's medical symptoms and needs. Procedural Technique Safety Precautions: Aspiration looking for blood return was conducted prior to all injections. At no point did we inject any substances, as a needle was being advanced. No attempts were made at seeking any paresthesias. Safe injection practices and needle disposal techniques used. Medications properly checked for expiration dates. SDV (single dose vial) medications used. Description of the Procedure: Protocol guidelines were followed. The patient was placed in position over the procedure table. The target area was identified and the area prepped in the usual manner. Skin & deeper tissues infiltrated with local anesthetic. Appropriate amount of time allowed to pass for local anesthetics to take effect. The procedure needles were then advanced to the target area. Proper needle placement  secured. Negative aspiration confirmed. Solution injected in intermittent fashion, asking for systemic symptoms every 0.5cc of injectate. The needles were then removed and the area cleansed, making  sure to leave some of the prepping solution back to take advantage of its long term bactericidal properties.  Vitals:   05/16/23 0911 05/16/23 0927 05/16/23 0932 05/16/23 0936  BP: 121/77 119/83 118/82 112/80  Pulse: 61     Resp:  18 18 17   Temp: (!) 97.4 F (36.3 C)     SpO2: 100% 99% 100% 95%  Weight: 158 lb (71.7 kg)     Height: 5\' 9"  (1.753 m)       Start Time: 0929 hrs. End Time: 0935 hrs.  Imaging Guidance (Spinal):          Type of Imaging Technique: Fluoroscopy Guidance (Spinal) Indication(s): Fluoroscopy guidance for needle placement to enhance accuracy in procedures requiring precise needle localization for targeted delivery of medication in or near specific anatomical locations not easily accessible without such real-time imaging assistance. Exposure Time: Please see nurses notes. Contrast: Before injecting any contrast, we confirmed that the patient did not have an allergy to iodine, shellfish, or radiological contrast. Once satisfactory needle placement was completed at the desired level, radiological contrast was injected. Contrast injected under live fluoroscopy. No contrast complications. See chart for type and volume of contrast used. Fluoroscopic Guidance: I was personally present during the use of fluoroscopy. "Tunnel Vision Technique" used to obtain the best possible view of the target area. Parallax error corrected before commencing the procedure. "Direction-depth-direction" technique used to introduce the needle under continuous pulsed fluoroscopy. Once target was reached, antero-posterior, oblique, and lateral fluoroscopic projection used confirm needle placement in all planes. Images permanently stored in EMR. Interpretation: I personally interpreted the imaging  intraoperatively. Adequate needle placement confirmed in multiple planes. Appropriate spread of contrast into desired area was observed. No evidence of afferent or efferent intravascular uptake. No intrathecal or subarachnoid spread observed. Permanent images saved into the patient's record.  Post-operative Assessment:  Post-procedure Vital Signs:  Pulse/HCG Rate: 6167 Temp: (!) 97.4 F (36.3 C) Resp: 17 BP: 112/80 SpO2: 95 %  EBL: None  Complications: No immediate post-treatment complications observed by team, or reported by patient.  Note: The patient tolerated the entire procedure well. A repeat set of vitals were taken after the procedure and the patient was kept under observation following institutional policy, for this type of procedure. Post-procedural neurological assessment was performed, showing return to baseline, prior to discharge. The patient was provided with post-procedure discharge instructions, including a section on how to identify potential problems. Should any problems arise concerning this procedure, the patient was given instructions to immediately contact us, at any time, without hesitation. In any case, we plan to contact the patient by telephone for a follow-up status report regarding this interventional procedure.  Comments:  No additional relevant information.  Plan of Care (POC)  Orders:  Orders Placed This Encounter  Procedures   DG PAIN CLINIC C-ARM 1-60 MIN NO REPORT    Intraoperative interpretation by procedural physician at [facility].    Standing Status:   Standing    Number of Occurrences:   1    Order Specific Question:   Reason for exam:    Answer:   Assistance in needle guidance and placement for procedures requiring needle placement in or near specific anatomical locations not easily accessible without such assistance.    Medications ordered for procedure: Meds ordered this encounter  Medications   iohexol (OMNIPAQUE) 180 MG/ML  injection 10 mL    Must be Myelogram-compatible. If not available, you may substitute with a water-soluble, non-ionic, hypoallergenic, myelogram-compatible  radiological contrast medium.   lidocaine (XYLOCAINE) 2 % (with pres) injection 400 mg   diazepam (VALIUM) tablet 5 mg    Make sure Flumazenil is available in the pyxis when using this medication. If oversedation occurs, administer 0.2 mg IV over 15 sec. If after 45 sec no response, administer 0.2 mg again over 1 min; may repeat at 1 min intervals; not to exceed 4 doses (1 mg)   sodium chloride flush (NS) 0.9 % injection 2 mL    This is for a two (2) level block. Use two (2) syringes and divide content in half.   ropivacaine (PF) 2 mg/mL (0.2%) (NAROPIN) injection 2 mL    This is for a two (2) level block. Use two (2) syringes and divide content in half.   dexamethasone (DECADRON) injection 20 mg    This is for a two (2) level block. Use two (2) syringes and divide content in half.   Medications administered: We administered iohexol, lidocaine, diazepam, sodium chloride flush, ropivacaine (PF) 2 mg/mL (0.2%), and dexamethasone.  See the medical record for exact dosing, route, and time of administration.  Follow-up plan:   Return in about 5 weeks (around 06/20/2023), or [id number] ppe.      Recent Visits Date Type Provider Dept  04/26/23 Office Visit Edward Jolly, MD [facility]  Showing recent visits within past 90 days and meeting all other requirements Today's Visits Date Type Provider Dept  05/16/23 Procedure visit Edward Jolly, MD [facility]  Showing today's visits and meeting all other requirements Future Appointments Date Type Provider Dept  06/27/23 Appointment Edward Jolly, MD [facility]  Showing future appointments within next 90 days and meeting all other requirements  Disposition: Discharge home  Discharge (Date  Time): 05/16/2023; 0945 hrs.   Primary Care Physician: Pcp, No Location: [facility] Note by: Edward Jolly, MD (TTS technology used. I apologize for any typographical errors that were not detected and corrected.) Date: 05/16/2023; Time: 9:48 AM  Disclaimer:  Medicine is not an Visual merchandiser. The only guarantee in medicine is that nothing is guaranteed. It is important to note that the decision to proceed with this intervention was based on the information collected from the patient. The Data and conclusions were drawn from the patient's questionnaire, the interview, and the physical examination. Because the information was provided in large part by the patient, it cannot be guaranteed that it has not been purposely or unconsciously manipulated. Every effort has been made to obtain as much relevant data as possible for this evaluation. It is important to note that the conclusions that lead to this procedure are derived in large part from the available data. Always take into account that the treatment will also be dependent on availability of resources and existing treatment guidelines, considered by other Pain Management Practitioners as being common knowledge and practice, at the time of the intervention. For Medico-Legal purposes, it is also important to point out that variation in procedural techniques and pharmacological choices are the acceptable norm. The indications, contraindications, technique, and results of the above procedure should only be interpreted and judged by a Board-Certified Interventional Pain Specialist with extensive familiarity and expertise in the same exact procedure and technique.

## 2023-05-16 NOTE — Progress Notes (Signed)
Safety precautions to be maintained throughout the outpatient stay will include: orient to surroundings, keep bed in low position, maintain call bell within reach at all times, provide assistance with transfer out of bed and ambulation.  

## 2023-05-18 ENCOUNTER — Telehealth: Payer: Self-pay

## 2023-05-18 NOTE — Telephone Encounter (Signed)
Post procedure follow up.  LM 

## 2023-06-27 ENCOUNTER — Ambulatory Visit
Payer: No Typology Code available for payment source | Admitting: Student in an Organized Health Care Education/Training Program

## 2023-07-12 ENCOUNTER — Ambulatory Visit
Payer: No Typology Code available for payment source | Attending: Student in an Organized Health Care Education/Training Program | Admitting: Student in an Organized Health Care Education/Training Program

## 2023-07-12 DIAGNOSIS — G8929 Other chronic pain: Secondary | ICD-10-CM

## 2023-07-12 DIAGNOSIS — M5416 Radiculopathy, lumbar region: Secondary | ICD-10-CM

## 2023-07-12 NOTE — Progress Notes (Signed)
Patient: Louis Cannon  Service Category: E/M  Provider: Edward Jolly, MD  DOB: March 11, 1981  DOS: 07/12/2023  Location: Office  MRN: [id number]  Setting: Ambulatory outpatient  Referring Provider: No ref. provider found  Type: Established Patient  Specialty: Interventional Pain Management  PCP: Pcp, No  Location: Remote location  Delivery: TeleHealth     Virtual Encounter - Pain Management PROVIDER NOTE: Information contained herein reflects review and annotations entered in association with encounter. Interpretation of such information and data should be left to medically-trained personnel. Information provided to patient can be located elsewhere in the medical record under "Patient Instructions". Document created using STT-dictation technology, any transcriptional errors that may result from process are unintentional.    Contact & Pharmacy Preferred: [phone] Home: [phone] (home) Mobile: [phone] (mobile) E-mail: [email or web address]   [facility] - [place], [place] - [address] [place] [address] Phone: [phone] Fax: [phone]   Pre-screening  Louis Cannon offered "in-person" vs "virtual" encounter. He indicated preferring virtual for this encounter.   Reason COVID-19*  Social distancing based on CDC and AMA recommendations.   I contacted Louis Cannon on 07/12/2023 via telephone.      I clearly identified myself as Edward Jolly, MD. I verified that I was speaking with the correct person using two identifiers (Name: Louis Cannon, and date of birth: 08-21-1980).  Consent I sought verbal advanced consent from Louis Cannon for virtual visit interactions. I informed Mr. Bart of possible security and privacy concerns, risks, and limitations associated with providing "not-in-person" medical evaluation and management services. I also informed Louis Cannon of the availability of "in-person" appointments. Finally, I informed him that there would be a charge for  the virtual visit and that he could be  personally, fully or partially, financially responsible for it. Louis Cannon expressed understanding and agreed to proceed.   Historic Elements   Louis Cannon is a 43 y.o. year old, male patient evaluated today after our last contact on 05/16/2023. Louis Cannon  has a past medical history of GERD (gastroesophageal reflux disease). He also  has no past surgical history on file. Louis Cannon has a current medication list which includes the following prescription(s): bupropion, celecoxib, gabapentin, lidocaine, pantoprazole, and sucralfate. He  reports that he has never smoked. He has quit using smokeless tobacco. He reports that he does not drink alcohol and does not use drugs. Louis Cannon has no known allergies.  BMI: Estimated body mass index is 23.33 kg/m as calculated from the following:   Height as of 05/16/23: 5\' 9"  (1.753 m).   Weight as of 05/16/23: 158 lb (71.7 kg). Last encounter: 04/26/2023. Last procedure: 05/16/2023.  HPI  Today, he is being contacted for a post-procedure assessment.   Post-procedure evaluation    Procedure: Lumbar trans-foraminal epidural steroid injection (L-TFESI) #1  Laterality: Left (-LT)  Level: L5 & S1 nerve root(s) Imaging: Fluoroscopy-guided         Anesthesia: Local anesthesia (1-2% Lidocaine) Sedation: Minimal Sedation                       DOS: 05/16/2023  Performed by: Edward Jolly, MD  Purpose: Diagnostic/Therapeutic Indications: Lumbar radicular pain severe enough to impact quality of life or function. No diagnosis found. NAS-11 Pain score:   Pre-procedure: 8 /10   Post-procedure: 2 /10     Effectiveness:  Initial hour after procedure: 100 %  Subsequent 4-6 hours post-procedure: 100 %  Analgesia  past initial 6 hours: 75 % (until fell on 07-03-2023)  Ongoing improvement:  Analgesic:  back to baseline Function: Back to baseline ROM: Back to baseline    Laboratory Chemistry Profile   Renal Lab  Results  Component Value Date   BUN 13 02/04/2016   CREATININE 0.97 02/04/2016   GFRAA >60 02/04/2016   GFRNONAA >60 02/04/2016    Hepatic Lab Results  Component Value Date   AST 22 02/04/2016   ALT 20 02/04/2016   ALBUMIN 4.3 02/04/2016   ALKPHOS 52 02/04/2016   LIPASE 19 02/04/2016    Electrolytes Lab Results  Component Value Date   NA 138 02/04/2016   K 3.6 02/04/2016   CL 106 02/04/2016   CALCIUM 9.0 02/04/2016    Bone No results found for: "VD25OH", "VD125OH2TOT", "ZO1096EA5", "WU9811BJ4", "25OHVITD1", "25OHVITD2", "25OHVITD3", "TESTOFREE", "TESTOSTERONE"  Inflammation (CRP: Acute Phase) (ESR: Chronic Phase) No results found for: "CRP", "ESRSEDRATE", "LATICACIDVEN"       Note: Above Lab results reviewed.   Assessment  The primary encounter diagnosis was Lumbar radiculopathy. 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  Plan of Care  Patient was doing well with his left L5 and S1 transforaminal epidural steroid injection and was having less pain in his left leg until he fell on January 14 while he was outside walking.  He states that his left leg gave out.  He went to [facility] where they did a CT scan of his spine which was largely unremarkable.  He did went to the [place] and a lumbar MRI was done.  I do not have the results but I informed the patient to try and get those to me.  Recommend repeating left L5 and S1 transforaminal ESI as before.  Follow-up plan:   Return in about 13 days (around 07/25/2023) for Left L5 and S1 TF ESI #2, in clinic (PO Valium 5mg ).     Recent Visits Date Type Provider Dept  05/16/23 Procedure visit Edward Jolly, MD [facility]  04/26/23 Office Visit Edward Jolly, MD [facility]  Showing recent visits within past 90 days and meeting all other requirements Today's Visits Date Type Provider Dept  07/12/23 Office Visit Edward Jolly, MD [facility]  Showing  today's visits and meeting all other requirements Future Appointments No visits were found meeting these conditions. Showing future appointments within next 90 days and meeting all other requirements  I discussed the assessment and treatment plan with the patient. The patient was provided an opportunity to ask questions and all were answered. The patient agreed with the plan and demonstrated an understanding of the instructions.  Patient advised to call back or seek an in-person evaluation if the symptoms or condition worsens.  Duration of encounter: .  Note by: Edward Jolly, MD Date: 07/12/2023; Time: 3:05 PM

## 2023-07-25 ENCOUNTER — Encounter: Payer: Self-pay | Admitting: Student in an Organized Health Care Education/Training Program

## 2023-07-25 ENCOUNTER — Ambulatory Visit
Admission: RE | Admit: 2023-07-25 | Discharge: 2023-07-25 | Disposition: A | Discharge status: Home / Self Care | Payer: No Typology Code available for payment source | Source: Ambulatory Visit | Attending: Student in an Organized Health Care Education/Training Program | Admitting: Student in an Organized Health Care Education/Training Program

## 2023-07-25 ENCOUNTER — Ambulatory Visit
Payer: No Typology Code available for payment source | Attending: Student in an Organized Health Care Education/Training Program | Admitting: Student in an Organized Health Care Education/Training Program

## 2023-07-25 VITALS — BP 108/99 | HR 71 | Temp 98.1°F | Resp 18 | Ht 69.0 in | Wt 152.0 lb

## 2023-07-25 DIAGNOSIS — M25552 Pain in left hip: Secondary | ICD-10-CM | POA: Diagnosis present

## 2023-07-25 DIAGNOSIS — G8929 Other chronic pain: Secondary | ICD-10-CM | POA: Diagnosis present

## 2023-07-25 DIAGNOSIS — M5416 Radiculopathy, lumbar region: Secondary | ICD-10-CM

## 2023-07-25 MED ORDER — ROPIVACAINE HCL 2 MG/ML IJ SOLN
2.0000 mL | Freq: Once | INTRAMUSCULAR | Status: AC
Start: 2023-07-25 — End: 2023-07-25
  Administered 2023-07-25: 2 mL via EPIDURAL

## 2023-07-25 MED ORDER — LIDOCAINE HCL 2 % IJ SOLN
20.0000 mL | Freq: Once | INTRAMUSCULAR | Status: AC
Start: 2023-07-25 — End: 2023-07-25
  Administered 2023-07-25: 400 mg

## 2023-07-25 MED ORDER — DEXAMETHASONE SODIUM PHOSPHATE 10 MG/ML IJ SOLN
INTRAMUSCULAR | Status: AC
Start: 2023-07-25 — End: ?
  Filled 2023-07-25: qty 2

## 2023-07-25 MED ORDER — DIAZEPAM 5 MG PO TABS
5.0000 mg | ORAL_TABLET | ORAL | Status: AC
  Administered 2023-07-25: 5 mg via ORAL

## 2023-07-25 MED ORDER — ROPIVACAINE HCL 2 MG/ML IJ SOLN
INTRAMUSCULAR | Status: AC
  Filled 2023-07-25: qty 20

## 2023-07-25 MED ORDER — SODIUM CHLORIDE (PF) 0.9 % IJ SOLN
INTRAMUSCULAR | Status: AC
  Filled 2023-07-25: qty 10

## 2023-07-25 MED ORDER — SODIUM CHLORIDE 0.9% FLUSH
2.0000 mL | Freq: Once | INTRAVENOUS | Status: AC
  Administered 2023-07-25: 2 mL

## 2023-07-25 MED ORDER — IOHEXOL 180 MG/ML  SOLN
INTRAMUSCULAR | Status: AC
  Filled 2023-07-25: qty 20

## 2023-07-25 MED ORDER — LIDOCAINE HCL 2 % IJ SOLN
INTRAMUSCULAR | Status: AC
  Filled 2023-07-25: qty 20

## 2023-07-25 MED ORDER — DEXAMETHASONE SODIUM PHOSPHATE 10 MG/ML IJ SOLN
20.0000 mg | Freq: Once | INTRAMUSCULAR | Status: AC
  Administered 2023-07-25: 20 mg

## 2023-07-25 MED ORDER — DIAZEPAM 5 MG PO TABS
ORAL_TABLET | ORAL | Status: AC
  Filled 2023-07-25: qty 1

## 2023-07-25 MED ORDER — IOHEXOL 180 MG/ML  SOLN
10.0000 mL | Freq: Once | INTRAMUSCULAR | Status: AC
  Administered 2023-07-25: 10 mL via EPIDURAL

## 2023-07-25 NOTE — Patient Instructions (Signed)
 Pain Management Discharge Instructions  General Discharge Instructions :  If you need to reach your doctor call: Monday-Friday 8:00 am - 4:00 pm at [phone] or toll free [phone].  After clinic hours [phone] to have operator reach doctor.  Bring all of your medication bottles to all your appointments in the pain clinic.  To cancel or reschedule your appointment with Pain Management please remember to call 24 hours in advance to avoid a fee.  Refer to the educational materials which you have been given on: General Risks, I had my Procedure. Discharge Instructions, Post Sedation.  Post Procedure Instructions:  The drugs you were given will stay in your system until tomorrow, so for the next 24 hours you should not drive, make any legal decisions or drink any alcoholic beverages.  You may eat anything you prefer, but it is better to start with liquids then soups and crackers, and gradually work up to solid foods.  Please notify your doctor immediately if you have any unusual bleeding, trouble breathing or pain that is not related to your normal pain.  Depending on the type of procedure that was done, some parts of your body may feel week and/or numb.  This usually clears up by tonight or the next day.  Walk with the use of an assistive device or accompanied by an adult for the 24 hours.  You may use ice on the affected area for the first 24 hours.  Put ice in a Ziploc bag and cover with a towel and place against area 15 minutes on 15 minutes off.  You may switch to heat after 24 hours.Selective Nerve Root Block Patient Information  Description: Specific nerve roots exit the spinal canal and these nerves can be compressed and inflamed by a bulging disc and bone spurs.  By injecting steroids on the nerve root, we can potentially decrease the inflammation surrounding these nerves, which often leads to decreased pain.  Also, by injecting local anesthesia on the nerve root, this can  provide [place] helpful information to give to your referring doctor if it decreases your pain.  Selective nerve root blocks can be done along the spine from the neck to the low back depending on the location of your pain.   After numbing the skin with local anesthesia, a small needle is passed to the nerve root and the position of the needle is verified using x-ray pictures.  After the needle is in correct position, we then deposit the medication.  You may experience a pressure sensation while this is being done.  The entire block usually lasts less than 15 minutes.  Conditions that may be treated with selective nerve root blocks: Low back and leg pain Spinal stenosis Diagnostic block prior to potential surgery Neck and arm pain Post laminectomy syndrome  Preparation for the injection:  Do not eat any solid food or dairy products within 8 hours of your appointment. You may drink clear liquids up to 3 hours before an appointment.  Clear liquids include water, black coffee, juice or soda.  No milk or cream please. You may take your regular medications, including pain medications, with a sip of water before your appointment.  Diabetics should hold regular insulin (if taken separately) and take 1/2 normal NPH dose the morning of the procedure.  Carry some sugar containing items with you to your appointment. A driver must accompany you and be prepared to drive you home after your procedure. Bring all your current medications with you. An IV  may be inserted and sedation may be given at the discretion of the physician. A blood pressure cuff, EKG, and other monitors will often be applied during the procedure.  Some patients may need to have extra oxygen administered for a short period. You will be asked to provide medical information, including allergies, prior to the procedure.  We must know immediately if you are taking blood  Thinners (like Coumadin) or if you are allergic to IV iodine contrast  (dye).  Possible side-effects: All are usually temporary Bleeding from needle site Light headedness Numbness and tingling Decreased blood pressure Weakness in arms/legs Pressure sensation in back/neck Pain at injection site (several days)  Possible complications: All are extremely rare Infection Nerve injury Spinal headache (a headache wore with upright position)  Call if you experience: Fever/chills associated with headache or increased back/neck pain Headache worsened by an upright position New onset weakness or numbness of an extremity below the injection site Hives or difficulty breathing (go to the emergency room) Inflammation or drainage at the injection site(s) Severe back/neck pain greater than usual New symptoms which are concerning to you  Please note:  Although the local anesthetic injected can often make your back or neck feel good for several hours after the injection the pain will likely return.  It takes 3-5 days for steroids to work on the nerve root. You may not notice any pain relief for at least one week.  If effective, we will often do a series of 3 injections spaced 3-6 weeks apart to maximally decrease your pain.    If you have any questions, please call [phone] [facility]

## 2023-07-25 NOTE — Progress Notes (Signed)
 PROVIDER NOTE: Interpretation of information contained herein should be left to medically-trained personnel. Specific patient instructions are provided elsewhere under Patient Instructions section of medical record. This document was created in part using STT-dictation technology, any transcriptional errors that may result from this process are unintentional.  Patient: Louis Cannon Type: Established DOB: 27-Feb-1981 MRN: [id number] PCP: Pcp, No  Service: Procedure DOS: 07/25/2023 Setting: Ambulatory Location: Ambulatory outpatient facility Delivery: Face-to-face Provider: Wallie Sherry, MD Specialty: Interventional Pain Management Specialty designation: 09 Location: Outpatient facility Ref. Prov.: No ref. provider found       Interventional Therapy   Procedure: Lumbar trans-foraminal epidural steroid injection (L-TFESI) #2  Laterality: Left (-LT)  Level: L5 & S1 nerve root(s) Imaging: Fluoroscopy-guided         Anesthesia: Local anesthesia (1-2% Lidocaine ) Sedation: Minimal Sedation                       DOS: 07/25/2023  Performed by: Wallie Sherry, MD  Purpose: Diagnostic/Therapeutic Indications: Lumbar radicular pain severe enough to impact quality of life or function. 1. Lumbar radiculopathy   2. 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 Left hip pain    NAS-11 Pain score:   Pre-procedure: 8 /10   Post-procedure: 8 /10     Position / Prep / Materials:  Position: Prone  Prep solution: ChloraPrep (2% chlorhexidine gluconate and 70% isopropyl alcohol) Prep Area: Entire Posterior Lumbosacral Area.  From the lower tip of the scapula down to the tailbone and from flank to flank. Materials:  Tray: Block Needle(s):  Type: Spinal  Gauge (G): 22  Length: 5-in  Qty: 2      H&P (Pre-op Assessment):  Louis Cannon is a 43 y.o. (year old), male patient, seen today for interventional treatment. He  has no past surgical history on file. Louis Cannon has a current medication list  which includes the following prescription(s): bupropion, celecoxib, gabapentin, lidocaine , pantoprazole, and sucralfate . His primarily concern today is the Back Pain (lower)  Initial Vital Signs:  Pulse/HCG Rate: 71ECG Heart Rate: 72 Temp: 98.1 F (36.7 C) Resp: 18 BP: 112/62 SpO2: 99 %  BMI: Estimated body mass index is 22.45 kg/m as calculated from the following:   Height as of this encounter: 5' 9 (1.753 m).   Weight as of this encounter: 152 lb (68.9 kg).  Risk Assessment: Allergies: Reviewed. He has no known allergies.  Allergy Precautions: None required Coagulopathies: Reviewed. None identified.  Blood-thinner therapy: None at this time Active Infection(s): Reviewed. None identified. Louis Cannon is afebrile  Site Confirmation: Louis Cannon was asked to confirm the procedure and laterality before marking the site Procedure checklist: Completed Consent: Before the procedure and under the influence of no sedative(s), amnesic(s), or anxiolytics, the patient was informed of the treatment options, risks and possible complications. To fulfill our ethical and legal obligations, as recommended by the American Medical Association's Code of Ethics, I have informed the patient of my clinical impression; the nature and purpose of the treatment or procedure; the risks, benefits, and possible complications of the intervention; the alternatives, including doing nothing; the risk(s) and benefit(s) of the alternative treatment(s) or procedure(s); and the risk(s) and benefit(s) of doing nothing. The patient was provided information about the general risks and possible complications associated with the procedure. These may include, but are not limited to: failure to achieve desired goals, infection, bleeding, organ or nerve damage, allergic reactions, paralysis, and death. In addition, the patient was  informed of those risks and complications associated to Spine-related procedures, such as failure to  decrease pain; infection (i.e.: Meningitis, epidural or intraspinal abscess); bleeding (i.e.: epidural hematoma, subarachnoid hemorrhage, or any other type of intraspinal or peri-dural bleeding); organ or nerve damage (i.e.: Any type of peripheral nerve, nerve root, or spinal cord injury) with subsequent damage to sensory, motor, and/or autonomic systems, resulting in permanent pain, numbness, and/or weakness of one or several areas of the body; allergic reactions; (i.e.: anaphylactic reaction); and/or death. Furthermore, the patient was informed of those risks and complications associated with the medications. These include, but are not limited to: allergic reactions (i.e.: anaphylactic or anaphylactoid reaction(s)); adrenal axis suppression; blood sugar elevation that in diabetics may result in ketoacidosis or comma; water retention that in patients with history of congestive heart failure may result in shortness of breath, pulmonary edema, and decompensation with resultant heart failure; weight gain; swelling or edema; medication-induced neural toxicity; particulate matter embolism and blood vessel occlusion with resultant organ, and/or nervous system infarction; and/or aseptic necrosis of one or more joints. Finally, the patient was informed that Medicine is not an exact science; therefore, there is also the possibility of unforeseen or unpredictable risks and/or possible complications that may result in a catastrophic outcome. The patient indicated having understood very clearly. We have given the patient no guarantees and we have made no promises. Enough time was given to the patient to ask questions, all of which were answered to the patient's satisfaction. Louis Cannon has indicated that he wanted to continue with the procedure. Attestation: I, the ordering provider, attest that I have discussed with the patient the benefits, risks, side-effects, alternatives, likelihood of achieving goals, and potential  problems during recovery for the procedure that I have provided informed consent. Date  Time: 07/25/2023  1:55 PM   Pre-Procedure Preparation:  Monitoring: As per clinic protocol. Respiration, ETCO2, SpO2, BP, heart rate and rhythm monitor placed and checked for adequate function Safety Precautions: Patient was assessed for positional comfort and pressure points before starting the procedure. Time-out: I initiated and conducted the Time-out before starting the procedure, as per protocol. The patient was asked to participate by confirming the accuracy of the Time Out information. Verification of the correct person, site, and procedure were performed and confirmed by me, the nursing staff, and the patient. Time-out conducted as per Joint Commission's Universal Protocol (UP.01.01.01). Time: 1448 Start Time: 1448 hrs.  Description/Narrative of Procedure:          Target: The 6 o'clock position under the pedicle, on the affected side. Region: Posterolateral Lumbosacral Approach: Posterior Percutaneous Paravertebral approach.  Rationale (medical necessity): procedure needed and proper for the diagnosis and/or treatment of the patient's medical symptoms and needs. Procedural Technique Safety Precautions: Aspiration looking for blood return was conducted prior to all injections. At no point did we inject any substances, as a needle was being advanced. No attempts were made at seeking any paresthesias. Safe injection practices and needle disposal techniques used. Medications properly checked for expiration dates. SDV (single dose vial) medications used. Description of the Procedure: Protocol guidelines were followed. The patient was placed in position over the procedure table. The target area was identified and the area prepped in the usual manner. Skin & deeper tissues infiltrated with local anesthetic. Appropriate amount of time allowed to pass for local anesthetics to take effect. The procedure  needles were then advanced to the target area. Proper needle placement secured. Negative aspiration confirmed. Solution injected in intermittent  fashion, asking for systemic symptoms every 0.5cc of injectate. The needles were then removed and the area cleansed, making sure to leave some of the prepping solution back to take advantage of its long term bactericidal properties.  Vitals:   07/25/23 1430 07/25/23 1448 07/25/23 1453 07/25/23 1455  BP: 118/85 114/71 119/85 (!) 108/99  Pulse:      Resp: 16 18 18 18   Temp:      SpO2: 98% 99% 100% 98%  Weight:      Height:        Start Time: 1448 hrs. End Time: 1454 hrs.  Imaging Guidance (Spinal):          Type of Imaging Technique: Fluoroscopy Guidance (Spinal) Indication(s): Fluoroscopy guidance for needle placement to enhance accuracy in procedures requiring precise needle localization for targeted delivery of medication in or near specific anatomical locations not easily accessible without such real-time imaging assistance. Exposure Time: Please see nurses notes. Contrast: Before injecting any contrast, we confirmed that the patient did not have an allergy to iodine, shellfish, or radiological contrast. Once satisfactory needle placement was completed at the desired level, radiological contrast was injected. Contrast injected under live fluoroscopy. No contrast complications. See chart for type and volume of contrast used. Fluoroscopic Guidance: I was personally present during the use of fluoroscopy. Tunnel Vision Technique used to obtain the best possible view of the target area. Parallax error corrected before commencing the procedure. Direction-depth-direction technique used to introduce the needle under continuous pulsed fluoroscopy. Once target was reached, antero-posterior, oblique, and lateral fluoroscopic projection used confirm needle placement in all planes. Images permanently stored in EMR. Interpretation: I personally interpreted the  imaging intraoperatively. Adequate needle placement confirmed in multiple planes. Appropriate spread of contrast into desired area was observed. No evidence of afferent or efferent intravascular uptake. No intrathecal or subarachnoid spread observed. Permanent images saved into the patient's record.  Post-operative Assessment:  Post-procedure Vital Signs:  Pulse/HCG Rate: 7172 Temp: 98.1 F (36.7 C) Resp: 18 BP: (!) 108/99 SpO2: 98 %  EBL: None  Complications: No immediate post-treatment complications observed by team, or reported by patient.  Note: The patient tolerated the entire procedure well. A repeat set of vitals were taken after the procedure and the patient was kept under observation following institutional policy, for this type of procedure. Post-procedural neurological assessment was performed, showing return to baseline, prior to discharge. The patient was provided with post-procedure discharge instructions, including a section on how to identify potential problems. Should any problems arise concerning this procedure, the patient was given instructions to immediately contact us , at any time, without hesitation. In any case, we plan to contact the patient by telephone for a follow-up status report regarding this interventional procedure.  Comments:  No additional relevant information.  Plan of Care (POC)  Orders:  Orders Placed This Encounter  Procedures   DG PAIN CLINIC C-ARM 1-60 MIN NO REPORT    Intraoperative interpretation by procedural physician at [facility].    Standing Status:   Standing    Number of Occurrences:   1    Reason for exam::   Assistance in needle guidance and placement for procedures requiring needle placement in or near specific anatomical locations not easily accessible without such assistance.    Medications ordered for procedure: Meds ordered this encounter  Medications   iohexol  (OMNIPAQUE ) 180 MG/ML injection 10 mL    Must be  Myelogram-compatible. If not available, you may substitute with a water-soluble, non-ionic, hypoallergenic,  myelogram-compatible radiological contrast medium.   lidocaine  (XYLOCAINE ) 2 % (with pres) injection 400 mg   sodium chloride  flush (NS) 0.9 % injection 2 mL    This is for a two (2) level block. Use two (2) syringes and divide content in half.   ropivacaine  (PF) 2 mg/mL (0.2%) (NAROPIN ) injection 2 mL    This is for a two (2) level block. Use two (2) syringes and divide content in half.   dexamethasone  (DECADRON ) injection 20 mg    This is for a two (2) level block. Use two (2) syringes and divide content in half.   diazepam  (VALIUM ) tablet 5 mg    Make sure Flumazenil is available in the pyxis when using this medication. If oversedation occurs, administer 0.2 mg IV over 15 sec. If after 45 sec no response, administer 0.2 mg again over 1 min; may repeat at 1 min intervals; not to exceed 4 doses (1 mg)   Medications administered: We administered iohexol , lidocaine , sodium chloride  flush, ropivacaine  (PF) 2 mg/mL (0.2%), dexamethasone , and diazepam .  See the medical record for exact dosing, route, and time of administration.  Follow-up plan:   Return in about 6 weeks (around 09/05/2023) for VV PPE.      Recent Visits Date Type Provider Dept  07/12/23 Office Visit Marcelino Nurse, MD [facility]  05/16/23 Procedure visit Marcelino Nurse, MD [facility]  04/26/23 Office Visit Marcelino Nurse, MD [facility]  Showing recent visits within past 90 days and meeting all other requirements Today's Visits Date Type Provider Dept  07/25/23 Procedure visit Marcelino Nurse, MD [facility]  Showing today's visits and meeting all other requirements Future Appointments Date Type Provider Dept  09/05/23 Appointment Marcelino Nurse, MD [facility]  Showing future appointments within next 90 days and meeting all other requirements  Disposition: Discharge  home  Discharge (Date  Time): 07/25/2023; 1505 hrs.   Primary Care Physician: Pcp, No Location: [facility] Note by: Nurse Marcelino, MD (TTS technology used. I apologize for any typographical errors that were not detected and corrected.) Date: 07/25/2023; Time: 3:12 PM  Disclaimer:  Medicine is not an visual merchandiser. The only guarantee in medicine is that nothing is guaranteed. It is important to note that the decision to proceed with this intervention was based on the information collected from the patient. The Data and conclusions were drawn from the patient's questionnaire, the interview, and the physical examination. Because the information was provided in large part by the patient, it cannot be guaranteed that it has not been purposely or unconsciously manipulated. Every effort has been made to obtain as much relevant data as possible for this evaluation. It is important to note that the conclusions that lead to this procedure are derived in large part from the available data. Always take into account that the treatment will also be dependent on availability of resources and existing treatment guidelines, considered by other Pain Management Practitioners as being common knowledge and practice, at the time of the intervention. For Medico-Legal purposes, it is also important to point out that variation in procedural techniques and pharmacological choices are the acceptable norm. The indications, contraindications, technique, and results of the above procedure should only be interpreted and judged by a Board-Certified Interventional Pain Specialist with extensive familiarity and expertise in the same exact procedure and technique.

## 2023-07-25 NOTE — Progress Notes (Signed)
 Safety precautions to be maintained throughout the outpatient stay will include: orient to surroundings, keep bed in low position, maintain call bell within reach at all times, provide assistance with transfer out of bed and ambulation.

## 2023-07-26 ENCOUNTER — Telehealth: Payer: Self-pay

## 2023-07-26 NOTE — Telephone Encounter (Signed)
 Post procedure follow up.  LM

## 2023-09-04 ENCOUNTER — Telehealth: Payer: Self-pay

## 2023-09-04 NOTE — Telephone Encounter (Signed)
 Nursing portion of telephone visit completed.

## 2023-09-05 ENCOUNTER — Ambulatory Visit
Payer: No Typology Code available for payment source | Attending: Student in an Organized Health Care Education/Training Program | Admitting: Student in an Organized Health Care Education/Training Program

## 2023-09-05 DIAGNOSIS — M25552 Pain in left hip: Secondary | ICD-10-CM | POA: Diagnosis not present

## 2023-09-05 DIAGNOSIS — M5416 Radiculopathy, lumbar region: Secondary | ICD-10-CM | POA: Diagnosis not present

## 2023-09-05 DIAGNOSIS — G8929 Other chronic pain: Secondary | ICD-10-CM

## 2023-09-05 NOTE — Patient Instructions (Signed)

## 2023-09-05 NOTE — Progress Notes (Signed)
 Patient: Louis Cannon  Service Category: E/M  Provider: Edward Jolly, MD  DOB: 05-Aug-1980  DOS: 09/05/2023  Location: Office  MRN: [id number]  Setting: Ambulatory outpatient  Referring Provider: No ref. provider found  Type: Established Patient  Specialty: Interventional Pain Management  PCP: Pcp, No  Location: Remote location  Delivery: TeleHealth     Virtual Encounter - Pain Management PROVIDER NOTE: Information contained herein reflects review and annotations entered in association with encounter. Interpretation of such information and data should be left to medically-trained personnel. Information provided to patient can be located elsewhere in the medical record under "Patient Instructions". Document created using STT-dictation technology, any transcriptional errors that may result from process are unintentional.    Contact & Pharmacy Preferred: [phone] Home: [phone] (home) Mobile: There is no such number on file (mobile). E-mail: [email or web address]  [facility] - [place], [place] - [address] [place] [address] Phone: [phone] Fax: [phone]   Pre-screening  Mr. Strohm offered "in-person" vs "virtual" encounter. He indicated preferring virtual for this encounter.   Reason COVID-19*  Social distancing based on CDC and AMA recommendations.   I contacted Rondel Oh on 09/05/2023 via telephone.      I clearly identified myself as Edward Jolly, MD. I verified that I was speaking with the correct person using two identifiers (Name: KAYDENN MCLEAR, and date of birth: 28-Nov-1980).  Consent I sought verbal advanced consent from Rondel Oh for virtual visit interactions. I informed Mr. Bottomley of possible security and privacy concerns, risks, and limitations associated with providing "not-in-person" medical evaluation and management services. I also informed Mr. Simerson of the availability of "in-person" appointments. Finally, I informed him that there  would be a charge for the virtual visit and that he could be  personally, fully or partially, financially responsible for it. Mr. Borba expressed understanding and agreed to proceed.   Historic Elements   Mr. KENN REKOWSKI is a 43 y.o. year old, male patient evaluated today after our last contact on 07/25/2023. Mr. Kaufhold  has a past medical history of GERD (gastroesophageal reflux disease). He also  has no past surgical history on file. Mr. Arnaud has a current medication list which includes the following prescription(s): bupropion, celecoxib, gabapentin, lidocaine, methocarbamol, oxycodone, pantoprazole, and sucralfate. He  reports that he has never smoked. He has quit using smokeless tobacco. He reports that he does not drink alcohol and does not use drugs. Mr. Pavek has no known allergies.  BMI: Estimated body mass index is 22.45 kg/m as calculated from the following:   Height as of 07/25/23: 5\' 9"  (1.753 m).   Weight as of 07/25/23: 152 lb (68.9 kg). Last encounter: 07/12/2023. Last procedure: 07/25/2023.  HPI  Today, he is being contacted for a post-procedure assessment.   Post-procedure evaluation    Procedure: Lumbar trans-foraminal epidural steroid injection (L-TFESI) #2  Laterality: Left (-LT)  Level: L5 & S1 nerve root(s) Imaging: Fluoroscopy-guided         Anesthesia: Local anesthesia (1-2% Lidocaine) Sedation: Minimal Sedation                       DOS: 07/25/2023  Performed by: Edward Jolly, MD  Purpose: Diagnostic/Therapeutic Indications: Lumbar radicular pain severe enough to impact quality of life or function. 1. Lumbar radiculopathy   2. 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 Left hip pain    NAS-11 Pain score:   Pre-procedure:  8 /10   Post-procedure: 8 /10     Effectiveness:  Initial hour after procedure: 100 %  Subsequent 4-6 hours post-procedure: 100 %  Analgesia past initial 6 hours: 95 % (95% relief as far as 08/22/23 when patient had back surgery at  the [facility])  Ongoing improvement:  Analgesic:  95% Function: Mr. Hanken reports improvement in function ROM: Mr. Magwood reports improvement in ROM   Laboratory Chemistry Profile   Renal Lab Results  Component Value Date   BUN 13 02/04/2016   CREATININE 0.97 02/04/2016   GFRAA >60 02/04/2016   GFRNONAA >60 02/04/2016    Hepatic Lab Results  Component Value Date   AST 22 02/04/2016   ALT 20 02/04/2016   ALBUMIN 4.3 02/04/2016   ALKPHOS 52 02/04/2016   LIPASE 19 02/04/2016    Electrolytes Lab Results  Component Value Date   NA 138 02/04/2016   K 3.6 02/04/2016   CL 106 02/04/2016   CALCIUM 9.0 02/04/2016    Bone No results found for: "VD25OH", "VD125OH2TOT", "ON6295MW4", "XL2440NU2", "25OHVITD1", "25OHVITD2", "25OHVITD3", "TESTOFREE", "TESTOSTERONE"  Inflammation (CRP: Acute Phase) (ESR: Chronic Phase) No results found for: "CRP", "ESRSEDRATE", "LATICACIDVEN"       Note: Above Lab results reviewed.   Assessment  The primary encounter diagnosis was Lumbar radiculopathy. 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  Plan of Care  Patient was doing well after his lumbar epidural steroid injection rating his pain relief at 95%.  He went on to have a lumbar spinal decompression at the [place] on August 22, 2023 which he is recovering from.  He notes incisional site tenderness which he states is improving.  He is ambulating without any issues and has been ambulating every day since his surgery.  Continue to monitor symptoms, follow-up as needed.  Follow-up plan:   Return if symptoms worsen or fail to improve.      Recent Visits Date Type Provider Dept  07/25/23 Procedure visit Edward Jolly, MD [facility]  07/12/23 Office Visit Edward Jolly, MD [facility]  Showing recent visits within past 90 days and meeting all other requirements Today's Visits Date Type Provider Dept  09/05/23 Office  Visit Edward Jolly, MD [facility]  Showing today's visits and meeting all other requirements Future Appointments No visits were found meeting these conditions. Showing future appointments within next 90 days and meeting all other requirements  I discussed the assessment and treatment plan with the patient. The patient was provided an opportunity to ask questions and all were answered. The patient agreed with the plan and demonstrated an understanding of the instructions.  Patient advised to call back or seek an in-person evaluation if the symptoms or condition worsens.  Duration of encounter: .  Note by: Edward Jolly, MD Date: 09/05/2023; Time: 3:50 PM

## 2024-07-24 ENCOUNTER — Ambulatory Visit
Admission: EM | Admit: 2024-07-24 | Discharge: 2024-07-24 | Disposition: A | Discharge status: Home / Self Care | Source: Home / Self Care

## 2024-07-24 DIAGNOSIS — K029 Dental caries, unspecified: Secondary | ICD-10-CM | POA: Diagnosis not present

## 2024-07-24 DIAGNOSIS — K047 Periapical abscess without sinus: Secondary | ICD-10-CM

## 2024-07-24 MED ORDER — AMOXICILLIN-POT CLAVULANATE 875-125 MG PO TABS: 1.0000 | ORAL_TABLET | Freq: Two times a day (BID) | ORAL | 0 refills | Status: AC

## 2024-07-24 NOTE — ED Triage Notes (Signed)
 Patient to Urgent Care with complaints of left sided mouth swelling/ lower dental pain.   Symptoms x2 days.  Rotating tylenol and ibuprofen.

## 2024-07-24 NOTE — ED Provider Notes (Signed)
 " [facility]    CSN: [id number] Arrival date & time: 07/24/24  1313      History   Chief Complaint Chief Complaint  Patient presents with   Oral Swelling    HPI Louis Cannon is a 44 y.o. male.   Louis Cannon, 44 year old male pt, presents to urgent care for evaluation of left-sided mouth swelling/dental pain x 2 days.  Patient has been alternating Tylenol and ibuprofen, afebrile in office. Pt works at [employer].  The history is provided by the patient. No language interpreter was used.    Past Medical History:  Diagnosis Date   GERD (gastroesophageal reflux disease)     Patient Active Problem List   Diagnosis Date Noted   Pain due to dental caries 07/24/2024   Dental infection 07/24/2024   Lumbar radiculopathy 04/26/2023   Left hip pain 04/26/2023    Past Surgical History:  Procedure Laterality Date   BACK SURGERY         Home Medications    Prior to Admission medications  Medication Sig Start Date End Date Taking? Authorizing Provider  amoxicillin -clavulanate (AUGMENTIN ) 875-125 MG tablet Take 1 tablet by mouth every 12 (twelve) hours. 07/24/24  Yes Jaclyn Andy, NP  gabapentin (NEURONTIN) 300 MG capsule Take 300 mg by mouth daily. 300  mg in the am. And 600 mg at bedtime 03/02/23  Yes [provider]  buPROPion (WELLBUTRIN SR) 150 MG 12 hr tablet Take 150 mg by mouth 2 (two) times daily. Patient not taking: Reported on 07/24/2024 07/23/17   [provider]  celecoxib (CELEBREX) 100 MG capsule Take 100 mg by mouth 2 (two) times daily. Patient not taking: Reported on 07/24/2024 03/02/23   [provider]  methocarbamol (ROBAXIN) 750 MG tablet Take by mouth. Patient not taking: Reported on 07/24/2024 08/23/23   [provider]  oxyCODONE (OXY IR/ROXICODONE) 5 MG immediate release tablet Take by mouth. Patient not taking: Reported on 07/24/2024 08/23/23   [provider]  pantoprazole (PROTONIX) 40 MG tablet Take  40 mg by mouth daily. Patient not taking: Reported on 07/24/2024    [provider]  sucralfate  (CARAFATE ) 1 GM/10ML suspension Take 10 mLs (1 g total) by mouth 4 (four) times daily. Patient not taking: Reported on 07/24/2024 02/05/16   Robinette Vermell PARAS, MD    Family History History reviewed. No pertinent family history.  Social History Social History[1]   Allergies   Patient has no known allergies.   Review of Systems Review of Systems  Constitutional:  Negative for fever.  HENT:  Positive for dental problem and facial swelling.   All other systems reviewed and are negative.    Physical Exam Triage Vital Signs ED Triage Vitals  Encounter Vitals Group     BP      Girls Systolic BP Percentile      Girls Diastolic BP Percentile      Boys Systolic BP Percentile      Boys Diastolic BP Percentile      Pulse      Resp      Temp      Temp src      SpO2      Weight      Height      Head Circumference      Peak Flow      Pain Score      Pain Loc      Pain Education      Exclude  from Growth Chart    No data found.  Updated Vital Signs BP 113/73   Pulse 82   Temp 98.1 F (36.7 C)   Resp 19   Wt 165 lb (74.8 kg)   SpO2 100%   BMI 24.37 kg/m   Visual Acuity Right Eye Distance:   Left Eye Distance:   Bilateral Distance:    Right Eye Near:   Left Eye Near:    Bilateral Near:     Physical Exam Vitals and nursing note reviewed.  Constitutional:      Appearance: He is well-developed and well-groomed.  HENT:     Head: Normocephalic.     Mouth/Throat:     Lips: Pink.     Mouth: Mucous membranes are moist.     Dentition: Abnormal dentition. Gingival swelling and dental caries present.     Comments: No trismus Poor dentition throughout No appreciable facial swelling noted Cardiovascular:     Rate and Rhythm: Normal rate.  Pulmonary:     Effort: Pulmonary effort is normal.  Neurological:     General: No focal deficit present.     Mental Status: He is  alert and oriented to person, place, and time.     GCS: GCS eye subscore is 4. GCS verbal subscore is 5. GCS motor subscore is 6.  Psychiatric:        Attention and Perception: Attention normal.        Mood and Affect: Mood normal.        Speech: Speech normal.        Behavior: Behavior normal. Behavior is cooperative.      UC Treatments / Results  Labs (all labs ordered are listed, but only abnormal results are displayed) Labs Reviewed - No data to display  EKG   Radiology No results found.  Procedures Procedures (including critical care time)  Medications Ordered in UC Medications - No data to display  Initial Impression / Assessment and Plan / UC Course  I have reviewed the triage vital signs and the nursing notes.  Pertinent labs & imaging results that were available during my care of the patient were reviewed by me and considered in my medical decision making (see chart for details).    Discussed exam findings and plan of care with pt: Please follow up with your dental provider, take antibiotic as directed. May use orafel as label directed for pain. Go to Er for new or worsening issues(unable to take fluids,unable to keep medication down, worsening pain/swelling). Avoid chewing gum, hard candy, crunching ice as it makes issues worse.  Patient verbalized understanding to this provider. Work note given.  Ddx: Dental infection,dental pain due to caries Final Clinical Impressions(s) / UC Diagnoses   Final diagnoses:  Pain due to dental caries  Dental infection     Discharge Instructions      Please follow up with your dental provider, take antibiotic as directed. May use oragel as label directed for pain, tylenol and or ibuprofen as well. Go to Er for new or worsening issues(unable to take fluids,unable to keep medication down, worsening pain/swelling). Avoid chewing gum, hard candy, crunching ice as it makes issues worse.     ED Prescriptions     Medication  Sig Dispense Auth. Provider   amoxicillin -clavulanate (AUGMENTIN ) 875-125 MG tablet Take 1 tablet by mouth every 12 (twelve) hours. 14 tablet Isiaih Hollenbach, Rilla, NP      PDMP not reviewed this encounter.     [1]  Social History  Tobacco Use   Smoking status: Never   Smokeless tobacco: Former  Building Services Engineer status: Never Used  Substance Use Topics   Alcohol use: No   Drug use: No     Dahna Hattabaugh, Rilla, NP 07/24/24 1534  "

## 2024-07-24 NOTE — Discharge Instructions (Addendum)
 Please follow up with your dental provider, take antibiotic as directed. May use oragel as label directed for pain, tylenol and or ibuprofen as well. Go to Er for new or worsening issues(unable to take fluids,unable to keep medication down, worsening pain/swelling). Avoid chewing gum, hard candy, crunching ice as it makes issues worse.
